# Patient Record
Sex: Male | Born: 1970 | Race: White | Hispanic: No | Marital: Married | Smoking: Never smoker
Health system: Southern US, Community
[De-identification: ages and names within clinical notes are randomized; demographics above are authoritative.]

## PROBLEM LIST (undated history)

## (undated) DIAGNOSIS — E739 Lactose intolerance, unspecified: Secondary | ICD-10-CM

## (undated) DIAGNOSIS — R011 Cardiac murmur, unspecified: Secondary | ICD-10-CM

## (undated) DIAGNOSIS — K219 Gastro-esophageal reflux disease without esophagitis: Secondary | ICD-10-CM

## (undated) DIAGNOSIS — T7840XA Allergy, unspecified, initial encounter: Secondary | ICD-10-CM

## (undated) DIAGNOSIS — E669 Obesity, unspecified: Secondary | ICD-10-CM

## (undated) HISTORY — PX: WISDOM TOOTH EXTRACTION: SHX21

## (undated) HISTORY — PX: TONSILLECTOMY: SUR1361

## (undated) HISTORY — DX: Obesity, unspecified: E66.9

## (undated) HISTORY — DX: Gastro-esophageal reflux disease without esophagitis: K21.9

## (undated) HISTORY — DX: Lactose intolerance, unspecified: E73.9

## (undated) HISTORY — DX: Allergy, unspecified, initial encounter: T78.40XA

## (undated) HISTORY — DX: Cardiac murmur, unspecified: R01.1

## (undated) HISTORY — PX: UPPER GASTROINTESTINAL ENDOSCOPY: SHX188

---

## 1992-01-28 HISTORY — PX: OTHER SURGICAL HISTORY: SHX169

## 1998-01-27 HISTORY — PX: ESOPHAGOGASTRODUODENOSCOPY: SHX1529

## 1998-11-08 ENCOUNTER — Encounter (INDEPENDENT_AMBULATORY_CARE_PROVIDER_SITE_OTHER): Payer: Self-pay | Admitting: Specialist

## 1998-11-08 ENCOUNTER — Other Ambulatory Visit: Admission: RE | Admit: 1998-11-08 | Discharge: 1998-11-08 | Payer: Self-pay | Admitting: Gastroenterology

## 1999-05-29 ENCOUNTER — Encounter: Payer: Self-pay | Admitting: Emergency Medicine

## 1999-05-29 ENCOUNTER — Emergency Department (HOSPITAL_COMMUNITY): Admission: EM | Admit: 1999-05-29 | Discharge: 1999-05-29 | Payer: Self-pay | Admitting: Emergency Medicine

## 2008-03-03 ENCOUNTER — Emergency Department (HOSPITAL_BASED_OUTPATIENT_CLINIC_OR_DEPARTMENT_OTHER): Admission: EM | Admit: 2008-03-03 | Discharge: 2008-03-03 | Payer: Self-pay | Admitting: Emergency Medicine

## 2010-06-11 ENCOUNTER — Encounter: Payer: Self-pay | Admitting: Family Medicine

## 2010-06-11 ENCOUNTER — Ambulatory Visit (INDEPENDENT_AMBULATORY_CARE_PROVIDER_SITE_OTHER): Payer: Managed Care, Other (non HMO) | Admitting: Family Medicine

## 2010-06-11 DIAGNOSIS — R209 Unspecified disturbances of skin sensation: Secondary | ICD-10-CM

## 2010-06-11 DIAGNOSIS — Z0000 Encounter for general adult medical examination without abnormal findings: Secondary | ICD-10-CM | POA: Insufficient documentation

## 2010-06-11 DIAGNOSIS — R5383 Other fatigue: Secondary | ICD-10-CM | POA: Insufficient documentation

## 2010-06-11 DIAGNOSIS — R5381 Other malaise: Secondary | ICD-10-CM

## 2010-06-11 DIAGNOSIS — R202 Paresthesia of skin: Secondary | ICD-10-CM | POA: Insufficient documentation

## 2010-06-11 DIAGNOSIS — E669 Obesity, unspecified: Secondary | ICD-10-CM

## 2010-06-11 NOTE — Assessment & Plan Note (Signed)
L face, no other neuro sxs.  Check B12.  Resolved currently.  Update [place] if returning for further eval.  CN intact today.

## 2010-06-11 NOTE — Assessment & Plan Note (Signed)
Tetanus shot due - forgot to give today. Discussed healthy eating and living. Preventative protocols updated. Return fasting for blood work.

## 2010-06-11 NOTE — Assessment & Plan Note (Signed)
Check routine blood work, B12.  If not improving, consider discussion/eval for OSA.

## 2010-06-11 NOTE — Assessment & Plan Note (Signed)
Body mass index is 35.75 kg/(m^2). Discussed healthy eating/living.  Pt's low weight has been 215 in past, getting back into exercise.

## 2010-06-11 NOTE — Progress Notes (Signed)
Subjective:    Patient ID: Phillip Evans, male    DOB: Jun 02, 1970, 40 y.o.   MRN: [id number]  HPI CC: new patient  First time here.  No previous MD.  Concern: facial tingling.  Last Thursday BP shot up (151/89) and L face felt numb (cheek).  Lasted a few days.  No facial weakness, no trouble speaking, no HA, dizziness, no dysphagia.  Has been taking testosterone booster for 2 wks - noted increased BP with this.  Helps with working out.  But feeling more tired with this.  Stopped this.  Also with fatigue, stays tired despite sleep.    H/o GERD - s/p EGD 2000, pt states normal.  controlled with prilosec and diet.  2 yrs ago lost 65lbs, then plateau.  Tries to eat diverse diet.  Lost of fruits/vegetables.  Takes MVI daily.  Watches salt intake.  Has cut out sodas.  Does drink sweet teas and trying to cut back.  Drinks plenty of water.  Stays active, getting back into exercise routine.  Has had personal trainer in past.  Reports knowledge about what diet should be.  Looking into gluten free diet.    Preventative: Unsure last tetanus shot.  Requests today. Last CPE - none Last blood work - unsure  Medications and allergies reviewed and updated as above. PMHx, SurgHx, SHx and FMHx reviewed for relevance and updated in chart.  Review of Systems  Constitutional: Negative for fever, chills, activity change, appetite change, fatigue and unexpected weight change.  HENT: Negative for hearing loss and neck pain.   Eyes: Negative for visual disturbance.  Respiratory: Negative for cough, choking, chest tightness, shortness of breath and wheezing.   Cardiovascular: Negative for chest pain, palpitations and leg swelling.  Gastrointestinal: Positive for nausea and diarrhea. Negative for vomiting, abdominal pain, constipation, blood in stool and abdominal distention.  Genitourinary: Negative for hematuria and difficulty urinating.  Musculoskeletal: Positive for back pain. Negative for myalgias and  arthralgias.  Skin: Negative for rash.  Neurological: Negative for dizziness, seizures, syncope and headaches.  Hematological: Does not bruise/bleed easily.  Psychiatric/Behavioral: Negative for dysphoric mood. The patient is not nervous/anxious.        Objective:   Physical Exam  Nursing note and vitals reviewed. Constitutional: He is oriented to person, place, and time. He appears well-developed and well-nourished. No distress.  HENT:  Head: Normocephalic and atraumatic.  Right Ear: External ear normal.  Left Ear: External ear normal.  Nose: Nose normal.  Mouth/Throat: Oropharynx is clear and moist. No oropharyngeal exudate.  Eyes: Conjunctivae and EOM are normal. Pupils are equal, round, and reactive to light. No scleral icterus.  Neck: Normal range of motion. Neck supple. No thyromegaly present.  Cardiovascular: Normal rate, regular rhythm, normal heart sounds and intact distal pulses.   No murmur heard. Pulses:      Radial pulses are 2+ on the right side, and 2+ on the left side.  Pulmonary/Chest: Effort normal and breath sounds normal. No respiratory distress. He has no wheezes. He has no rales.  Abdominal: Soft. Bowel sounds are normal. He exhibits no distension and no mass. There is no tenderness. There is no rebound and no guarding.  Musculoskeletal: Normal range of motion.  Lymphadenopathy:    He has no cervical adenopathy.  Neurological: He is alert and oriented to person, place, and time. No cranial nerve deficit. Coordination normal.       CN grossly intact, station and gait intact  Skin: Skin is warm and dry.  No rash noted.  Psychiatric: He has a normal mood and affect. His behavior is normal. Judgment and thought content normal.          Assessment & Plan:

## 2010-06-11 NOTE — Patient Instructions (Signed)
Return at your convenience fasting for blood work. Keep working in diet and carbs.  More lentils, beans, nuts to help with bad cholesterol in general. Return as needed or in 1-2 years for next physical.

## 2010-06-12 ENCOUNTER — Other Ambulatory Visit (INDEPENDENT_AMBULATORY_CARE_PROVIDER_SITE_OTHER): Payer: Managed Care, Other (non HMO) | Admitting: Family Medicine

## 2010-06-12 DIAGNOSIS — Z0000 Encounter for general adult medical examination without abnormal findings: Secondary | ICD-10-CM

## 2010-06-12 DIAGNOSIS — R5381 Other malaise: Secondary | ICD-10-CM

## 2010-06-12 DIAGNOSIS — R5383 Other fatigue: Secondary | ICD-10-CM

## 2010-06-12 DIAGNOSIS — Z23 Encounter for immunization: Secondary | ICD-10-CM

## 2010-06-12 DIAGNOSIS — Z13220 Encounter for screening for lipoid disorders: Secondary | ICD-10-CM

## 2010-06-12 LAB — LIPID PANEL
Cholesterol: 223 mg/dL — ABNORMAL HIGH (ref 0–200)
HDL: 43.1 mg/dL (ref >39.00)
Total CHOL/HDL Ratio: 5
Triglycerides: 198 mg/dL — ABNORMAL HIGH (ref 0.0–149.0)
VLDL: 39.6 mg/dL (ref 0.0–40.0)

## 2010-06-12 LAB — COMPREHENSIVE METABOLIC PANEL
ALT: 51 U/L (ref 0–53)
AST: 31 U/L (ref 0–37)
Albumin: 4 g/dL (ref 3.5–5.2)
Alkaline Phosphatase: 59 U/L (ref 39–117)
BUN: 11 mg/dL (ref 6–23)
CO2: 30 mEq/L (ref 19–32)
Calcium: 9.5 mg/dL (ref 8.4–10.5)
Chloride: 103 mEq/L (ref 96–112)
Creatinine, Ser: 0.9 mg/dL (ref 0.4–1.5)
GFR: 96.94 mL/min (ref >60.00)
Glucose, Bld: 78 mg/dL (ref 70–99)
Potassium: 4.4 mEq/L (ref 3.5–5.1)
Sodium: 141 mEq/L (ref 135–145)
Total Bilirubin: 0.4 mg/dL (ref 0.3–1.2)
Total Protein: 7.2 g/dL (ref 6.0–8.3)

## 2010-06-12 LAB — LDL CHOLESTEROL, DIRECT: Direct LDL: 160.3 mg/dL

## 2010-06-12 LAB — TSH: TSH: 2.6 u[IU]/mL (ref 0.35–5.50)

## 2010-06-17 ENCOUNTER — Encounter: Payer: Self-pay | Admitting: Family Medicine

## 2010-06-17 ENCOUNTER — Ambulatory Visit (INDEPENDENT_AMBULATORY_CARE_PROVIDER_SITE_OTHER): Payer: Managed Care, Other (non HMO) | Admitting: Family Medicine

## 2010-06-17 VITALS — BP 116/78 | HR 76 | Temp 98.3°F

## 2010-06-17 DIAGNOSIS — L0390 Cellulitis, unspecified: Secondary | ICD-10-CM

## 2010-06-17 MED ORDER — DOXYCYCLINE HYCLATE 100 MG PO CAPS: 100.0000 mg | ORAL_CAPSULE | Freq: Two times a day (BID) | ORAL | Status: AC

## 2010-06-17 NOTE — Assessment & Plan Note (Signed)
Concern for cellulitis vs reaction to tetanus shot. Tetanus added to allergy list, treat with elevation of arm, and doxycycline x 10 days to cover infection. Update [place] if not improving as expected. Outlined erythema with sharpie.

## 2010-06-17 NOTE — Patient Instructions (Signed)
Reaction to Tdap or skin infection Elevate arm, ibuprofen, and start doxycycline twice daily for 10 days. Let us know if spreading or fevers or not improving as expected.

## 2010-06-17 NOTE — Progress Notes (Signed)
  Subjective:    Patient ID: Phillip Evans, male    DOB: 1970/02/26, 40 y.o.   MRN: [id number]  HPI CC: tetanus shot reaction?  Given Tdap 06/12/2010.  After that had sore arm but 3-4 days after shot started noticing redness right arm medial to injection site as well as tenderness.  Feels redness spreading some comes in today to be checked.  Review of Systems No fevers/chills, inability to move arm, pruritis, warmth    Objective:   Physical Exam  [nursing notereviewed. Constitutional: He appears well-developed and well-nourished. No distress.  Skin: Skin is warm and dry.          Left medial arm with ~6cm diameter erythematous macule, + calor.  No fluctuance, induration.          Assessment & Plan:

## 2011-10-08 ENCOUNTER — Encounter: Payer: Self-pay | Admitting: Family Medicine

## 2011-10-08 ENCOUNTER — Ambulatory Visit (INDEPENDENT_AMBULATORY_CARE_PROVIDER_SITE_OTHER): Payer: Managed Care, Other (non HMO) | Admitting: Family Medicine

## 2011-10-08 VITALS — BP 140/82 | HR 82 | Temp 98.2°F | Ht 70.0 in | Wt 238.8 lb

## 2011-10-08 DIAGNOSIS — J209 Acute bronchitis, unspecified: Secondary | ICD-10-CM

## 2011-10-08 MED ORDER — ALBUTEROL SULFATE HFA 108 (90 BASE) MCG/ACT IN AERS: 2.0000 | INHALATION_SPRAY | RESPIRATORY_TRACT | Status: DC | PRN

## 2011-10-08 MED ORDER — AZITHROMYCIN 250 MG PO TABS: ORAL_TABLET | ORAL | Status: AC

## 2011-10-08 NOTE — Assessment & Plan Note (Signed)
Will cover with zithromax  Fluids/rest Albuterol inhaler prn - inst in use  Disc symptomatic care - see instructions on AVS  Pt did not want any px cough products  Update if not starting to improve in a week or if worsening

## 2011-10-08 NOTE — Progress Notes (Signed)
Subjective:    Patient ID: Phillip Evans, male    DOB: April 22, 1970, 41 y.o.   MRN: [id number]  HPI Here for uri symptoms  Last week felt really sick - with body aches and chills--fever  Now has some chest congestion and started coughing -- nasty colored mucous Crackles when he breathes , and cannot stop coughing at night  Never had any nasal or throat symptoms   Is not a smoker  Some wheeze for a few days  Took some mucinex dm - not very helpful   Was exp to father with pneumonia- but this was 3 weeks ago   Patient Active Problem List  Diagnosis  . Routine general medical examination at a health care facility  . Obesity  . Fatigue  . Paresthesia  . Cellulitis   Past Medical History  Diagnosis Date  . Obesity   . GERD (gastroesophageal reflux disease)   . Lactose intolerance    Past Surgical History  Procedure Date  . Esophagogastroduodenoscopy 2000    WNL per pt  . R hand surgery 1994    with tendon reconstruction   History  Substance Use Topics  . Smoking status: Never Smoker   . Smokeless tobacco: Never Used  . Alcohol Use: No   Family History  Problem Relation Age of Onset  . Diabetes Father   . Cancer Maternal Grandmother     stomach cancer  . Coronary artery disease Maternal Grandfather 40    CAD/MI  . Cancer Maternal Grandfather     unsure which  . Hypertension Maternal Grandfather   . ALS Maternal Grandfather   . Hypertension Paternal Grandfather    Allergies  Allergen Reactions  . Tetanus Toxoids Other (See Comments)    Swollen, red arm   Current Outpatient Prescriptions on File Prior to Visit  Medication Sig Dispense Refill  . omeprazole (PRILOSEC OTC) 20 MG tablet Take 20 mg by mouth daily as needed.               Review of Systems    Review of Systems  Constitutional: Negative for  and unexpected weight change. pos for malaise sn fatigue and loss of appetite Eyes: Negative for pain and visual disturbance ENT neg for congestion and  ST.  Respiratory: pos for cough with some wheezing, neg for sob   Cardiovascular: Negative for cp or palpitations    Gastrointestinal: Negative for nausea, diarrhea and constipation.  Genitourinary: Negative for urgency and frequency.  Skin: Negative for pallor or rash   Neurological: Negative for weakness, light-headedness, numbness and headaches.  Hematological: Negative for adenopathy. Does not bruise/bleed easily.  Psychiatric/Behavioral: Negative for dysphoric mood. The patient is not nervous/anxious.      Objective:   Physical Exam  Constitutional: He appears well-developed and well-nourished. No distress.       obese and well appearing   HENT:  Head: Normocephalic and atraumatic.  Right Ear: External ear normal.  Left Ear: External ear normal.  Mouth/Throat: Oropharynx is clear and moist.       Nares are boggy No facial tenderness  Eyes: Conjunctivae normal and EOM are normal. Pupils are equal, round, and reactive to light. Right eye exhibits no discharge. Left eye exhibits no discharge.  Neck: Normal range of motion. Neck supple. No thyromegaly present.  Cardiovascular: Normal rate, regular rhythm and normal heart sounds.   Pulmonary/Chest: Effort normal. No respiratory distress. He has wheezes. He has no rales. He exhibits no tenderness.  Diffuse rhonchi No rales Scattered wheeze Nl exp phase  Musculoskeletal: He exhibits no edema.  Lymphadenopathy:    He has no cervical adenopathy.  Neurological: He is alert.  Skin: Skin is warm and dry. No rash noted.  Psychiatric: He has a normal mood and affect.          Assessment & Plan:

## 2011-10-08 NOTE — Patient Instructions (Addendum)
Drink lots of fluids  Rest and take mucinex DM for congestion and cough  Take the zpak for bronchitis  The albuterol inhaler if needed for wheezing  Update if not starting to improve in a week or if worsening

## 2012-01-08 ENCOUNTER — Emergency Department (HOSPITAL_BASED_OUTPATIENT_CLINIC_OR_DEPARTMENT_OTHER)
Admission: EM | Admit: 2012-01-08 | Discharge: 2012-01-08 | Disposition: A | Discharge status: Home / Self Care | Payer: Managed Care, Other (non HMO) | Attending: Emergency Medicine | Admitting: Emergency Medicine

## 2012-01-08 ENCOUNTER — Encounter (HOSPITAL_BASED_OUTPATIENT_CLINIC_OR_DEPARTMENT_OTHER): Payer: Self-pay | Admitting: *Deleted

## 2012-01-08 ENCOUNTER — Emergency Department (HOSPITAL_BASED_OUTPATIENT_CLINIC_OR_DEPARTMENT_OTHER): Payer: Managed Care, Other (non HMO)

## 2012-01-08 DIAGNOSIS — M7989 Other specified soft tissue disorders: Secondary | ICD-10-CM | POA: Insufficient documentation

## 2012-01-08 DIAGNOSIS — K219 Gastro-esophageal reflux disease without esophagitis: Secondary | ICD-10-CM | POA: Insufficient documentation

## 2012-01-08 DIAGNOSIS — E669 Obesity, unspecified: Secondary | ICD-10-CM | POA: Insufficient documentation

## 2012-01-08 NOTE — ED Notes (Signed)
Patient states he woke up on Saturday morning with pain in his left foot.  States he developed swelling on Monday.  No known injury.

## 2012-01-08 NOTE — ED Provider Notes (Signed)
History     CSN: [id number]  Arrival date & time 01/08/12  4098   First MD Initiated Contact with Patient 01/08/12 1906      Chief Complaint  Patient presents with  . Foot Pain    left    (Consider location/radiation/quality/duration/timing/severity/associated sxs/prior treatment) HPI Comments: Pt denies any known injury and states that the symptoms started on 5 days ago, but he the swelling to the left foot didn't start to 3 days ago:pt denies any history of similar symptoms:denies any redness or warmth:pt states that the pain radiates up the shin  Patient is a 41 y.o. male presenting with lower extremity pain. The history is provided by the patient. No language interpreter was used.  Foot Pain This is a new problem. The current episode started in the past 7 days. The problem occurs constantly. The problem has been unchanged. Pertinent negatives include no chest pain. The symptoms are aggravated by bending and walking. He has tried nothing for the symptoms.    Past Medical History  Diagnosis Date  . Obesity   . GERD (gastroesophageal reflux disease)   . Lactose intolerance     Past Surgical History  Procedure Date  . Esophagogastroduodenoscopy 2000    WNL per pt  . R hand surgery 1994    with tendon reconstruction    Family History  Problem Relation Age of Onset  . Diabetes Father   . Cancer Maternal Grandmother     stomach cancer  . Coronary artery disease Maternal Grandfather 55    CAD/MI  . Cancer Maternal Grandfather     unsure which  . Hypertension Maternal Grandfather   . ALS Maternal Grandfather   . Hypertension Paternal Grandfather     History  Substance Use Topics  . Smoking status: Never Smoker   . Smokeless tobacco: Never Used  . Alcohol Use: No      Review of Systems  Constitutional: Negative.   Respiratory: Negative.  Negative for shortness of breath.   Cardiovascular: Negative.  Negative for chest pain.    Allergies  Tetanus  toxoids  Home Medications   Current Outpatient Rx  Name  Route  Sig  Dispense  Refill  . ALBUTEROL SULFATE HFA 108 (90 BASE) MCG/ACT IN AERS   Inhalation   Inhale 2 puffs into the lungs every 4 (four) hours as needed for wheezing.   1 Inhaler   0   . OMEPRAZOLE MAGNESIUM 20 MG PO TBEC   Oral   Take 20 mg by mouth daily as needed.             BP 149/91  Pulse 71  Temp 98.7 F (37.1 C) (Oral)  Resp 20  SpO2 100%  Physical Exam  Nursing note and vitals reviewed. Constitutional: He is oriented to person, place, and time. He appears well-developed and well-nourished.  HENT:  Head: Normocephalic and atraumatic.  Cardiovascular: Normal rate and regular rhythm.   Pulmonary/Chest: Effort normal and breath sounds normal.  Musculoskeletal: Normal range of motion. He exhibits no tenderness.       gneralized swelling noted to the left foot:no redness swelling noted to the area:pulses intact:pt has full rom  Neurological: He is alert and oriented to person, place, and time.  Skin: Skin is warm and dry.  Psychiatric: He has a normal mood and affect.    ED Course  Procedures (including critical care time)  Labs Reviewed - No data to display US Venous Img Lower Unilateral Left  01/08/2012  *  RADIOLOGY REPORT*  Clinical Data: Left foot pain and swelling.  No known injury.  LEFT LOWER EXTREMITY VENOUS DUPLEX ULTRASOUND  Technique: Gray-scale sonography with graded compression, as well as color Doppler and duplex ultrasound, were performed to evaluate the deep venous system of the left lower extremity from the level of the common femoral vein through the popliteal and proximal calf veins. Spectral Doppler was utilized to evaluate flow at rest and with distal augmentation maneuvers.  Comparison: None.  Findings:  Normal compressibility of  the left common femoral, superficial femoral, and popliteal veins is demonstrated, as well as the visualized proximal calf veins.  No filling defects to  suggest DVT on grayscale or color Doppler imaging.  Doppler waveforms show normal direction of venous flow, normal respiratory phasicity and response to augmentation.  IMPRESSION: No evidence of  left lower extremity deep vein thrombosis.   Original Report Authenticated By: Beckie Salts, M.D.    Dg Foot Complete Left  01/08/2012  *RADIOLOGY REPORT*  Clinical Data: Swelling and numbness.  No injury.  No pain.  LEFT FOOT - COMPLETE 3+ VIEW  Comparison: None.  Findings: Anatomic alignment.  No fracture.  Soft tissues appear within normal limits.  No radiopaque foreign body.  Small insertional Achilles spur incidentally noted.  IMPRESSION: No acute osseous abnormality.   Original Report Authenticated By: Andreas Newport, M.D.      1. Foot swelling       MDM  No fracture on clot noted on imaging:exam not consistent with gout or cellulitis:pt has follow up with pcp as needed        Teressa Lower, NP 01/08/12 2135

## 2012-01-08 NOTE — ED Notes (Signed)
Patient transported to X-ray 

## 2012-01-08 NOTE — ED Notes (Signed)
NP at bedside.

## 2012-01-09 NOTE — ED Provider Notes (Signed)
Medical screening examination/treatment/procedure(s) were performed by non-physician practitioner and as supervising physician I was immediately available for consultation/collaboration.  Jones Skene, M.D.     Jones Skene, MD 01/09/12 2114

## 2012-01-30 ENCOUNTER — Ambulatory Visit: Payer: Managed Care, Other (non HMO) | Admitting: Family Medicine

## 2012-02-04 ENCOUNTER — Ambulatory Visit (INDEPENDENT_AMBULATORY_CARE_PROVIDER_SITE_OTHER): Payer: Managed Care, Other (non HMO) | Admitting: Family Medicine

## 2012-02-04 ENCOUNTER — Encounter: Payer: Self-pay | Admitting: Family Medicine

## 2012-02-04 VITALS — BP 124/82 | HR 64 | Temp 98.3°F | Wt 240.5 lb

## 2012-02-04 DIAGNOSIS — Z23 Encounter for immunization: Secondary | ICD-10-CM

## 2012-02-04 DIAGNOSIS — L0291 Cutaneous abscess, unspecified: Secondary | ICD-10-CM

## 2012-02-04 DIAGNOSIS — L0390 Cellulitis, unspecified: Secondary | ICD-10-CM

## 2012-02-04 NOTE — Assessment & Plan Note (Signed)
Resolving well. Recommend finish antibiotics, see pt instructions for plan. Not consistent with gout.

## 2012-02-04 NOTE — Patient Instructions (Signed)
I do think the cellulitis is improving. Finish bactrim. May use aleve or advil as needed. Keep leg elevated. Continue warm compresses

## 2012-02-04 NOTE — Progress Notes (Signed)
  Subjective:    Patient ID: Phillip Evans, male    DOB: 1970-10-28, 42 y.o.   MRN: [id number]  HPI CC: R foot  Recheck of R foot swelling.  Seen at ER with normal xray and doppler 2 wks ago, normal workup.   Next week seen at urgent care (last week), dx with cellulitis, treated with bactrim DS 2 pills bid. Swelling down.  Mildly tender.  Warmth better.  Denies inciting trauma/injury.  Denies break in skin.  No h/o gout.  Past Medical History  Diagnosis Date  . Obesity   . GERD (gastroesophageal reflux disease)   . Lactose intolerance     Review of Systems Per HPI    Objective:   Physical Exam Good pulses L dorsal foot at 3rd MT shaft some induration and erythema, minimal tenderness, no warmth.  No fluctuance. No pedal edema.    Assessment & Plan:

## 2012-05-13 ENCOUNTER — Ambulatory Visit (INDEPENDENT_AMBULATORY_CARE_PROVIDER_SITE_OTHER): Payer: Managed Care, Other (non HMO) | Admitting: Family Medicine

## 2012-05-13 ENCOUNTER — Encounter: Payer: Self-pay | Admitting: Family Medicine

## 2012-05-13 VITALS — BP 130/90 | HR 74 | Temp 98.0°F | Ht 70.0 in | Wt 240.5 lb

## 2012-05-13 DIAGNOSIS — M7989 Other specified soft tissue disorders: Secondary | ICD-10-CM | POA: Insufficient documentation

## 2012-05-13 MED ORDER — CLOTRIMAZOLE 1 % EX CREA: TOPICAL_CREAM | Freq: Two times a day (BID) | CUTANEOUS | Status: DC

## 2012-05-13 MED ORDER — SULFAMETHOXAZOLE-TMP DS 800-160 MG PO TABS: 2.0000 | ORAL_TABLET | Freq: Two times a day (BID) | ORAL | Status: DC

## 2012-05-13 NOTE — Assessment & Plan Note (Signed)
Left foot. In h/o recent cellulitis.  Will treat as presumed rpt cellulitis with bactrim.   Evidence of tinea pedis - discussed treatment of this to decrease risk of reinfection.  Sent in clotrimazole. Recommended elevation of leg as well as increased activity. If persistent, consider checking uric acid.

## 2012-05-13 NOTE — Progress Notes (Signed)
  Subjective:    Patient ID: Phillip Evans, male    DOB: 03-22-1970, 42 y.o.   MRN: [id number]  HPI CC: L foot ?cellulitis recurrence  Treated for foot cellulitis 01/2012 with bactrim, sxs resolved well after one course of abx.  For last few days noticing swelling again as well as mild erythema at top of foot.  When started, felt burning pain at top of foot, but no pain currently.  No fevers/chills, nausea, leg swelling.  Sedentary job, sitting all day. No regular exercise.  Prior ER evaluation (12/2011) included normal xray and doppler.  Wt Readings from Last 3 Encounters:  05/13/12 240 lb 8 oz (109.09 kg)  02/04/12 240 lb 8 oz (109.09 kg)  10/08/11 238 lb 12 oz (108.296 kg)    Past Medical History  Diagnosis Date  . Obesity   . GERD (gastroesophageal reflux disease)   . Lactose intolerance      Review of Systems Per HPI    Objective:   Physical Exam WDWN CM, NAD 2+ DP bilaterally. Sensation intact. Normal cap refill Left foot with mild nonpitting edema dorsally as well as mild erythema.   Maceration and skin breakdown L interdigital web between toes at 3rd/4th toe     Assessment & Plan:

## 2012-05-13 NOTE — Patient Instructions (Signed)
I am worried about repeat cellulitis.  Treat with bactrim twice daily for 10 days. You do have some skin breakdown between toes - fungal infection.  Treat with clotrimazole (lotrimin) twice daily for 3-4 weeks.  After shower, pat skin dry and place cotton between those toes to help keep dry. Let me know how you do with this.

## 2012-05-16 ENCOUNTER — Emergency Department: Payer: Self-pay | Admitting: Emergency Medicine

## 2012-05-19 ENCOUNTER — Encounter: Payer: Self-pay | Admitting: Family Medicine

## 2012-05-19 ENCOUNTER — Telehealth: Payer: Self-pay | Admitting: Family Medicine

## 2012-05-19 ENCOUNTER — Ambulatory Visit (INDEPENDENT_AMBULATORY_CARE_PROVIDER_SITE_OTHER): Payer: Managed Care, Other (non HMO) | Admitting: Family Medicine

## 2012-05-19 VITALS — BP 134/92 | HR 68 | Temp 98.2°F | Wt 240.8 lb

## 2012-05-19 DIAGNOSIS — S8991XA Unspecified injury of right lower leg, initial encounter: Secondary | ICD-10-CM

## 2012-05-19 DIAGNOSIS — M7989 Other specified soft tissue disorders: Secondary | ICD-10-CM

## 2012-05-19 DIAGNOSIS — S99929A Unspecified injury of unspecified foot, initial encounter: Secondary | ICD-10-CM

## 2012-05-19 NOTE — Assessment & Plan Note (Signed)
R partial calf muscle tear - agree with treatment. Discussed future stretching/strengthening calf muscle. ?proximal fibular fracture on xray although not impressive tenderness on palpation today. Regardless, will refer to ortho for further eval of this as well as further management of calf muscle tear.

## 2012-05-19 NOTE — Patient Instructions (Signed)
Let's decrease bactrim to one tablet twice a day for 10 day course. Pass by [facility]'s office for referral to orthopedist. Good to see you today, call us with questions.

## 2012-05-19 NOTE — Telephone Encounter (Signed)
Patient says he is having no pain at this time so he doesn't want to see an Orthopedist at this time. He will call us back if he changes his mind.

## 2012-05-19 NOTE — Assessment & Plan Note (Signed)
Prior concern for L dorsal foot cellulitis. Nausea with bactrim DS 2 bid.  Decrease to 1 bid for full 10 d course.   Improving.  Finish course.

## 2012-05-19 NOTE — Progress Notes (Signed)
  Subjective:    Patient ID: Phillip Evans, male    DOB: 1970/09/03, 42 y.o.   MRN: [id number]  HPI CC: ER f/u for calf tear  Seen at ER 05/16/2012 with concern for left partial gastroc tear sustained while chasing dog, up incline.  Felt popping sensation and had difficulty ambulating afterwards. Records reviewed. Treated with crutches, rest, ice, percocets.  Didn't fill percocets because make him sick.  Only using advil 400mg  prn. Xray of R tib/fib concerning for small proximal fibular fracture on right. Referred to Dr. Ernest Pine ortho by [facility] ER but pt has not scheduled - states prefers [place]. Never had bruising.  R calf swelling. Not using crutches.  No prolonged immobility.  No recent long car or plane rides.  No family or personal hx blood clots.  2 yrs ago had similar injury episode - same right side.  Improved after rest.  Seen here last week with concern for recurring left foot cellulitis, started on bactrim at that time.   Bactrim   Past Medical History  Diagnosis Date  . Obesity   . GERD (gastroesophageal reflux disease)   . Lactose intolerance      Review of Systems Per HPI    Objective:   Physical Exam  Musculoskeletal:  Right calf swelling present. L calf circ 41.5cm R calf circ 45cm  2+ DP bilaterally Left dorsal foot improved edema, mild erythema remains. L 3rd/4th Interdigital web healing.  Mildly tender to palpation proximal fibula on right  Tender to palpation right calf muscle No tenderness to palpation achilles       Assessment & Plan:

## 2014-01-16 ENCOUNTER — Ambulatory Visit (INDEPENDENT_AMBULATORY_CARE_PROVIDER_SITE_OTHER): Payer: Managed Care, Other (non HMO) | Admitting: Family Medicine

## 2014-01-16 ENCOUNTER — Encounter: Payer: Self-pay | Admitting: Family Medicine

## 2014-01-16 VITALS — BP 120/84 | HR 65 | Temp 98.2°F | Ht 69.0 in | Wt 229.5 lb

## 2014-01-16 DIAGNOSIS — M7542 Impingement syndrome of left shoulder: Secondary | ICD-10-CM

## 2014-01-16 DIAGNOSIS — M7522 Bicipital tendinitis, left shoulder: Secondary | ICD-10-CM

## 2014-01-16 DIAGNOSIS — E669 Obesity, unspecified: Secondary | ICD-10-CM

## 2014-01-16 NOTE — Progress Notes (Signed)
Dr. Frederico Hamman T. Kristjan Derner, MD, [facility] [place], [address] Phone: [phone] Fax: [phone]  01/16/2014  Patient: Phillip Evans, MRN: [id number], DOB: 02-21-1970, 43 y.o.  Primary Physician:  Ria Bush, MD  Chief Complaint: Shoulder Pain  Subjective:   This 43 y.o. male patient noted above presents with shoulder pain that has been ongoing for 6 months there is no history of trauma or accident recently The patient denies neck pain or radicular symptoms. Denies dislocation, subluxation, separation of the shoulder. The patient does complain of pain in the overhead plane with significant painful arc of motion.  Left shoulder, for the last six months has noticed that his shoulder is weak. Is a little bit painful. Painful with movement in the bicipital groove.   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   Medications Tried: Tylenol, NSAIDS Ice or Heat: minimally helpful Tried PT: No  Prior shoulder Injury: No Prior surgery: No Prior fracture: No  Also questions about obesity  The PMH, PSH, Social History, Family History, Medications, and allergies have been reviewed in [facility], and have been updated if relevant.  GEN: No fevers, chills. Nontoxic. Primarily MSK c/o today. MSK: Detailed in the HPI GI: tolerating PO intake without difficulty Neuro: No numbness, parasthesias, or tingling associated. Otherwise the pertinent positives of the ROS are noted above.   Objective:   Blood pressure 120/84, pulse 65, temperature 98.2 F (36.8 C), temperature source Oral, height 5\' 9"  (1.753 m), weight 229 lb 8 oz (104.101 kg).  GEN: Well-developed,well-nourished,in no acute distress; alert,appropriate and cooperative throughout examination HEENT: Normocephalic and atraumatic without obvious abnormalities. Ears, externally no deformities PULM: Breathing comfortably in no respiratory  distress EXT: No clubbing, cyanosis, or edema PSYCH: Normally interactive. Cooperative during the interview. Pleasant. Friendly and conversant. Not anxious or depressed appearing. Normal, full affect.  Shoulder: L Inspection: No muscle wasting or winging Ecchymosis/edema: neg  AC joint, scapula, clavicle: NT Cervical spine: NT, full ROM Spurling's: neg Abduction: full, 5/5 Flexion: full, 5/5 IR, full, lift-off: 5/5 ER at neutral: full, 5/5 AC crossover: neg Neer: pos Hawkins: pos Drop Test: neg Empty Can: pos Supraspinatus insertion: mild-mod T Bicipital groove: NT Speed's: POS on L Yergason's: POS on L Sulcus sign: neg Scapular dyskinesis: none C5-T1 intact  Neuro: Sensation intact Grip 5/5   Radiology: No results found.  Assessment and Plan:    Shoulder impingement, left - Plan: Ambulatory referral to Physical Therapy  Biceps tendonitis on left - Plan: Ambulatory referral to Physical Therapy  Obesity  Rotator cuff strengthening and scapular stabilization exercises were reviewed with the patient.  Retraining shoulder mechanics and function was emphasized to the patient with rehab done at least 5-6 days a week.  The patient could benefit from formal PT to assist with scapular stabilization and RTC strengthening.  >25 minutes spent in face to face time with patient, >50% spent in counselling or coordination of care: In addition to above, additional 15 minutes of counseling regarding obesity.  The patient is frustrated with his inability to lose weight.  He is not eating all that well, he continues to drink sugary drinks.  He is working more than 80 hours a week, and he is not sleeping very much.  I did my best to counsel him, regarding a healthy diet, exercise pattern, and lifestyle.  In my opinion there is no simple diet that will take care of this, and he needs  to have a whole scale lifestyle change.   Follow-up: if no improvement in 6-8 weeks  New Prescriptions    No medications on file   Orders Placed This Encounter  Procedures  . Ambulatory referral to Physical Therapy    Signed,  Frederico Hamman T. Daymon Hora, MD   Patient's Medications  New Prescriptions   No medications on file  Previous Medications   OMEPRAZOLE (PRILOSEC OTC) 20 MG TABLET    Take 20 mg by mouth daily as needed.    Modified Medications   No medications on file  Discontinued Medications   CLOTRIMAZOLE (LOTRIMIN) 1 % CREAM    Apply topically 2 (two) times daily. For 3-4 weeks   SULFAMETHOXAZOLE-TRIMETHOPRIM (BACTRIM DS) 800-160 MG PER TABLET    Take 2 tablets by mouth 2 (two) times daily.

## 2014-01-16 NOTE — Progress Notes (Signed)
Pre visit review using our clinic review tool, if applicable. No additional management support is needed unless otherwise documented below in the visit note. 

## 2015-05-22 ENCOUNTER — Emergency Department
Admission: EM | Admit: 2015-05-22 | Discharge: 2015-05-22 | Disposition: A | Discharge status: Home / Self Care | Payer: Managed Care, Other (non HMO) | Attending: Emergency Medicine | Admitting: Emergency Medicine

## 2015-05-22 ENCOUNTER — Encounter: Payer: Self-pay | Admitting: *Deleted

## 2015-05-22 ENCOUNTER — Emergency Department: Payer: Managed Care, Other (non HMO)

## 2015-05-22 DIAGNOSIS — K529 Noninfective gastroenteritis and colitis, unspecified: Secondary | ICD-10-CM | POA: Diagnosis not present

## 2015-05-22 DIAGNOSIS — R1013 Epigastric pain: Secondary | ICD-10-CM | POA: Diagnosis present

## 2015-05-22 DIAGNOSIS — E669 Obesity, unspecified: Secondary | ICD-10-CM | POA: Insufficient documentation

## 2015-05-22 DIAGNOSIS — Z79899 Other long term (current) drug therapy: Secondary | ICD-10-CM | POA: Insufficient documentation

## 2015-05-22 LAB — COMPREHENSIVE METABOLIC PANEL
ALK PHOS: 65 U/L (ref 38–126)
ALT: 28 U/L (ref 17–63)
ANION GAP: 10 (ref 5–15)
AST: 24 U/L (ref 15–41)
Albumin: 4.7 g/dL (ref 3.5–5.0)
BILIRUBIN TOTAL: 0.3 mg/dL (ref 0.3–1.2)
BUN: 16 mg/dL (ref 6–20)
CALCIUM: 9.7 mg/dL (ref 8.9–10.3)
CO2: 24 mmol/L (ref 22–32)
CREATININE: 1.01 mg/dL (ref 0.61–1.24)
Chloride: 103 mmol/L (ref 101–111)
GFR calc non Af Amer: >60 mL/min (ref >60)
Glucose, Bld: 127 mg/dL — ABNORMAL HIGH (ref 65–99)
Potassium: 4 mmol/L (ref 3.5–5.1)
SODIUM: 137 mmol/L (ref 135–145)
TOTAL PROTEIN: 8.2 g/dL — AB (ref 6.5–8.1)

## 2015-05-22 LAB — CBC
HCT: 42.8 % (ref 40.0–52.0)
HEMOGLOBIN: 15.1 g/dL (ref 13.0–18.0)
MCH: 28.3 pg (ref 26.0–34.0)
MCHC: 35.2 g/dL (ref 32.0–36.0)
MCV: 80.2 fL (ref 80.0–100.0)
PLATELETS: 235 10*3/uL (ref 150–440)
RBC: 5.34 MIL/uL (ref 4.40–5.90)
RDW: 13.6 % (ref 11.5–14.5)
WBC: 10.5 10*3/uL (ref 3.8–10.6)

## 2015-05-22 LAB — LIPASE, BLOOD: Lipase: 17 U/L (ref 11–51)

## 2015-05-22 LAB — TROPONIN I: Troponin I: <0.03 ng/mL (ref <0.031)

## 2015-05-22 MED ORDER — ONDANSETRON HCL 4 MG/2ML IJ SOLN
4.0000 mg | Freq: Once | INTRAMUSCULAR | Status: AC
  Administered 2015-05-22: 4 mg via INTRAVENOUS
  Filled 2015-05-22: qty 2

## 2015-05-22 MED ORDER — METRONIDAZOLE 500 MG PO TABS: 500.0000 mg | ORAL_TABLET | Freq: Two times a day (BID) | ORAL | Status: DC

## 2015-05-22 MED ORDER — CIPROFLOXACIN HCL 500 MG PO TABS: 500.0000 mg | ORAL_TABLET | Freq: Two times a day (BID) | ORAL | Status: DC

## 2015-05-22 MED ORDER — IOPAMIDOL (ISOVUE-300) INJECTION 61%
100.0000 mL | Freq: Once | INTRAVENOUS | Status: AC | PRN
  Administered 2015-05-22: 100 mL via INTRAVENOUS

## 2015-05-22 MED ORDER — GI COCKTAIL ~~LOC~~
30.0000 mL | Freq: Once | ORAL | Status: AC
  Administered 2015-05-22: 30 mL via ORAL
  Filled 2015-05-22: qty 30

## 2015-05-22 MED ORDER — DIATRIZOATE MEGLUMINE & SODIUM 66-10 % PO SOLN
15.0000 mL | Freq: Once | ORAL | Status: AC
  Administered 2015-05-22: 15 mL via ORAL
  Filled 2015-05-22: qty 30

## 2015-05-22 MED ORDER — MORPHINE SULFATE (PF) 4 MG/ML IV SOLN
4.0000 mg | Freq: Once | INTRAVENOUS | Status: AC
  Administered 2015-05-22: 4 mg via INTRAVENOUS
  Filled 2015-05-22: qty 1

## 2015-05-22 NOTE — ED Notes (Signed)
States he woke up this AM with LUQ abd pain at 3 am, states vomiting and lightheaded, burning in nature, states last BM was this AM

## 2015-05-22 NOTE — ED Notes (Signed)
Patient transported to CT 

## 2015-05-22 NOTE — ED Provider Notes (Signed)
[facility] Emergency Department Provider Note  ____________________________________________    I have reviewed the triage vital signs and the nursing notes.   HISTORY  Chief Complaint Abdominal Pain    HPI Phillip Evans is a 45 y.o. male who presents with epigastric abdominal pain which started approximately 2 AM this morning. He has had nausea vomiting and has felt lightheaded. He is having normal BMs. No fevers or chills. No history of similar pain. No history of abdominal surgeries. He reports the pain is burning and sharp in nature     Past Medical History  Diagnosis Date  . Obesity   . GERD (gastroesophageal reflux disease)   . Lactose intolerance     Patient Active Problem List   Diagnosis Date Noted  . Right leg injury 05/19/2012  . Foot swelling 05/13/2012  . Obesity 06/11/2010  . Fatigue 06/11/2010  . Paresthesia 06/11/2010    Past Surgical History  Procedure Laterality Date  . Esophagogastroduodenoscopy  2000    WNL per pt  . R hand surgery  1994    with tendon reconstruction    Current Outpatient Rx  Name  Route  Sig  Dispense  Refill  . omeprazole (PRILOSEC OTC) 20 MG tablet   Oral   Take 20 mg by mouth daily as needed.           . ciprofloxacin (CIPRO) 500 MG tablet   Oral   Take 1 tablet (500 mg total) by mouth 2 (two) times daily.   14 tablet   0   . metroNIDAZOLE (FLAGYL) 500 MG tablet   Oral   Take 1 tablet (500 mg total) by mouth 2 (two) times daily after a meal.   14 tablet   0     Allergies Oxycodone and Tetanus toxoids  Family History  Problem Relation Age of Onset  . Diabetes Father   . Cancer Maternal Grandmother     stomach cancer  . Coronary artery disease Maternal Grandfather 44    CAD/MI  . Cancer Maternal Grandfather     unsure which  . Hypertension Maternal Grandfather   . ALS Maternal Grandfather   . Hypertension Paternal Grandfather     Social History Social History  Substance  Use Topics  . Smoking status: Never Smoker   . Smokeless tobacco: Never Used  . Alcohol Use: No    Review of Systems  Constitutional: Negative for fever. Eyes: Negative for redness ENT: Negative for sore throat Cardiovascular: Negative for chest pain Respiratory: Negative for shortness of breath. Gastrointestinal: Abdominal pain as above Genitourinary: Negative for dysuria. Musculoskeletal: Negative for back pain. Skin: Negative for rash. Neurological: Negative for focal weakness Psychiatric: no anxiety    ____________________________________________   PHYSICAL EXAM:  VITAL SIGNS: ED Triage Vitals  Enc Vitals Group     BP 05/22/15 0835 158/89 mmHg     Pulse Rate 05/22/15 0835 61     Resp 05/22/15 0835 18     Temp 05/22/15 0835 97.8 F (36.6 C)     Temp Source 05/22/15 0835 Oral     SpO2 05/22/15 0835 99 %     Weight 05/22/15 0835 230 lb (104.327 kg)     Height 05/22/15 0835 5\' 9"  (1.753 m)     Head Cir --      Peak Flow --      Pain Score 05/22/15 0836 6     Pain Loc --      Pain Edu? --  Excl. in [place]? --      Constitutional: Alert and oriented. Well appearing and in no distress.  Eyes: Conjunctivae are normal. No erythema or injection ENT   Head: Normocephalic and atraumatic.   Mouth/Throat: Mucous membranes are moist. Cardiovascular: Normal rate, regular rhythm. Normal and symmetric distal pulses are present in the upper extremities. Respiratory: Normal respiratory effort without tachypnea nor retractions. Breath sounds are clear and equal bilaterally.  Gastrointestinal: Mild tenderness in the epigastrium.. No distention. There is no CVA tenderness. Genitourinary: deferred  Neurologic:  Normal speech and language. No gross focal neurologic deficits are appreciated. Skin:  Skin is warm, dry and intact. No rash noted. Psychiatric: Mood and affect are normal. Patient exhibits appropriate insight and  judgment.  ____________________________________________    LABS (pertinent positives/negatives)  Labs Reviewed  COMPREHENSIVE METABOLIC PANEL - Abnormal; Notable for the following:    Glucose, Bld 127 (*)    Total Protein 8.2 (*)    All other components within normal limits  LIPASE, BLOOD  CBC  TROPONIN I  URINALYSIS COMPLETEWITH MICROSCOPIC ([facility] ONLY)    ____________________________________________   EKG  ED ECG REPORT I, Lavonia Drafts, the attending physician, personally viewed and interpreted this ECG.  Date: 05/22/2015 EKG Time: 8:40 AM Rate: 58 Rhythm: Sinus bradycardia QRS Axis: normal Intervals: normal ST/T Wave abnormalities: normal Conduction Disturbances: none    ____________________________________________    RADIOLOGY  CT abdomen and pelvis most consistent with enteritis  ____________________________________________   PROCEDURES  Procedure(s) performed: none  Critical Care performed:none  ____________________________________________   INITIAL IMPRESSION / ASSESSMENT AND PLAN / ED COURSE  Pertinent labs & imaging results that were available during my care of the patient were reviewed by me and considered in my medical decision making (see chart for details).  Patient presents with epigastric discomfort, he probably has a history of GERD. Differential includes gastritis, pancreatitis, cholecystitis. We will give a GI cocktail and reevaluate.  Patient had brief improvement with GI cocktail. We will obtain CT scan.  CT is most consistent with enteritis. We will treat with antibiotics and by mouth pain medications. We discussed return precautions ____________________________________________   FINAL CLINICAL IMPRESSION(S) / ED DIAGNOSES  Final diagnoses:  Enteritis          Lavonia Drafts, MD 05/22/15 1534

## 2015-05-23 ENCOUNTER — Ambulatory Visit (INDEPENDENT_AMBULATORY_CARE_PROVIDER_SITE_OTHER): Payer: Managed Care, Other (non HMO) | Admitting: Family Medicine

## 2015-05-23 ENCOUNTER — Telehealth: Payer: Self-pay | Admitting: Family Medicine

## 2015-05-23 ENCOUNTER — Encounter: Payer: Self-pay | Admitting: Family Medicine

## 2015-05-23 VITALS — BP 120/90 | HR 75 | Temp 97.9°F | Wt 227.2 lb

## 2015-05-23 DIAGNOSIS — K529 Noninfective gastroenteritis and colitis, unspecified: Secondary | ICD-10-CM

## 2015-05-23 MED ORDER — ONDANSETRON HCL 4 MG PO TABS: 4.0000 mg | ORAL_TABLET | Freq: Three times a day (TID) | ORAL | Status: DC | PRN

## 2015-05-23 NOTE — Progress Notes (Signed)
Subjective:  Patient ID: Phillip Evans, male    DOB: 08/26/1970  Age: 45 y.o. MRN: [id number]  CC: Abdominal pain, diarrhea, N/V  HPI:  45 year old male presents with the above complaints.  Patient reports that at 2 AM on 4/25 he began to not feel well. He subsequently developed epigastric and left upper quadrant pain. Pain described as a burning sensation. Pain was severe. He then developed diarrhea and nausea and vomiting. He had to 3 episodes of diarrhea and vomiting. Pain continued and given the severity of his pain he went to the emergency department for evaluation. He is ED course was reviewed and is summarized as below: Labs normal. CT scan with findings consistent with enteritis. No other abdominal pathology noted. He was placed on Cipro and Flagyl and discharged home in stable condition. Abdominal pain improved.  He presents today for follow-up regarding this. He states that he currently feels weak but is no longer having any pain, diarrhea, vomiting. He still feels slightly nauseated. He is otherwise feeling well. He is concerned about his recent symptoms especially in the setting of having a similar illness 2 times previously in the last 4 months.  Social Hx   Social History   Social History  . Marital Status: Married    Spouse Name: N/A  . Number of Children: 3  . Years of Education: college   Occupational History  . Secondary school teacher Other   Social History Main Topics  . Smoking status: Never Smoker   . Smokeless tobacco: Never Used  . Alcohol Use: No  . Drug Use: No  . Sexual Activity: Not Asked   Other Topics Concern  . None   Social History Narrative   Caffeine: 4 caffeinated beverages a day   Lives with wife, 3 children, 2 cats         Review of Systems  Constitutional: Positive for fatigue. Negative for fever.  Gastrointestinal: Positive for nausea, vomiting, abdominal pain and diarrhea. Negative for blood in stool.   Objective:  BP 120/90 mmHg  Pulse  75  Temp(Src) 97.9 F (36.6 C) (Oral)  Wt 227 lb 4 oz (103.08 kg)  SpO2 97%  BP/Weight 05/23/2015 05/22/2015 [id number]  Systolic BP 123456 [id number]  Diastolic BP 90 79 84  Wt. (Lbs) 227.25 230 229.5  BMI 33.54 33.95 33.88   Physical Exam  Constitutional: He is oriented to person, place, and time. He appears well-developed. No distress.  Cardiovascular: Normal rate and regular rhythm.   Pulmonary/Chest: Effort normal. He has no wheezes. He has no rales.  Abdominal: Soft. He exhibits no distension. There is no tenderness. There is no rebound and no guarding.  Neurological: He is alert and oriented to person, place, and time.  Psychiatric: He has a normal mood and affect.  Vitals reviewed.  Lab Results  Component Value Date   WBC 10.5 05/22/2015   HGB 15.1 05/22/2015   HCT 42.8 05/22/2015   PLT 235 05/22/2015   GLUCOSE 127* 05/22/2015   CHOL 223* 06/12/2010   TRIG 198.0* 06/12/2010   HDL 43.10 06/12/2010   LDLDIRECT 160.3 06/12/2010   ALT 28 05/22/2015   AST 24 05/22/2015   NA 137 05/22/2015   K 4.0 05/22/2015   CL 103 05/22/2015   CREATININE 1.01 05/22/2015   BUN 16 05/22/2015   CO2 24 05/22/2015   TSH 2.60 06/12/2010   Assessment & Plan:   Problem List Items Addressed This Visit    Enteritis - Primary  New problem. Likely viral in origin. I'm not sure why the emergency department place him on antibiotics. I advised him to continue as he's restarted. His symptoms are now essentially resolved, excluding the nausea. Treating with Zofran. Follow-up as needed.         Meds ordered this encounter  Medications  . ondansetron (ZOFRAN) 4 MG tablet    Sig: Take 1 tablet (4 mg total) by mouth every 8 (eight) hours as needed for nausea or vomiting.    Dispense:  20 tablet    Refill:  0   Follow-up: PRN  [facility] [place]

## 2015-05-23 NOTE — Telephone Encounter (Signed)
Pt has appt to see Dr Lacinda Axon 05/23/15 at 4 pm.

## 2015-05-23 NOTE — Patient Instructions (Signed)
Take the zofran as needed.  Call/follow up if you worsen or this recurs.  Take care  Dr. Lacinda Axon

## 2015-05-23 NOTE — Progress Notes (Signed)
Pre visit review using our clinic review tool, if applicable. No additional management support is needed unless otherwise documented below in the visit note. 

## 2015-05-23 NOTE — Telephone Encounter (Signed)
Patient Name: SAMAD MCCASKEY  DOB: May 27, 1970    Initial Comment caller states he has nausea and vomiting; has urinated   Nurse Assessment  Nurse: Mallie Mussel, RN, Alveta Heimlich Date/Time Eilene Ghazi Time): 05/23/2015 10:31:25 AM  Confirm and document reason for call. If symptomatic, describe symptoms. You must click the next button to save text entered. ---Caller states that he was seen in ER yesterday for severe abdominal pain. Was prescribed an antiboitic and an antifungal for swelling of his intestines. He states that he was taking the medication with food as directed and usually vomits afterwards. He is wanting to know if something could be called in for him for N&V. He has vomited x 4-6 since he started yesterday. He last urinated about 30 minutes ago.  Has the patient traveled out of the country within the last 30 days? ---No  Does the patient have any new or worsening symptoms? ---Yes  Will a triage be completed? ---Yes  Related visit to physician within the last 2 weeks? ---Yes  Does the PT have any chronic conditions? (i.e. diabetes, asthma, etc.) ---No  Is this a behavioral health or substance abuse call? ---No     Guidelines    Guideline Title Affirmed Question Affirmed Notes  Vomiting MILD or MODERATE vomiting (e.g., 1 - 5 times / day) (all triage questions negative)    Final Disposition User   [place], RN, Alveta Heimlich    Comments  denies abdominal pain at present, it comes and goes.  Wants to be seen by doctor.  No appointments available wth Dr. Leo Grosser and others at [facility]. I was able to book an appointment for himi at [facility] with Dr. Thersa Salt for 4:00pm today.   Referrals  REFERRED TO PCP OFFICE   Disagree/Comply: Disagree  Disagree/Comply Reason: Disagree with instructions

## 2015-05-23 NOTE — Assessment & Plan Note (Signed)
New problem. Likely viral in origin. I'm not sure why the emergency department place him on antibiotics. I advised him to continue as he's restarted. His symptoms are now essentially resolved, excluding the nausea. Treating with Zofran. Follow-up as needed.

## 2015-08-28 ENCOUNTER — Encounter: Payer: Self-pay | Admitting: Primary Care

## 2015-08-28 ENCOUNTER — Ambulatory Visit (INDEPENDENT_AMBULATORY_CARE_PROVIDER_SITE_OTHER): Payer: Managed Care, Other (non HMO) | Admitting: Primary Care

## 2015-08-28 ENCOUNTER — Other Ambulatory Visit: Payer: Self-pay | Admitting: Primary Care

## 2015-08-28 VITALS — BP 134/84 | HR 67 | Temp 98.1°F | Ht 69.0 in | Wt 239.4 lb

## 2015-08-28 DIAGNOSIS — R5383 Other fatigue: Secondary | ICD-10-CM

## 2015-08-28 DIAGNOSIS — E538 Deficiency of other specified B group vitamins: Secondary | ICD-10-CM

## 2015-08-28 DIAGNOSIS — E559 Vitamin D deficiency, unspecified: Secondary | ICD-10-CM

## 2015-08-28 DIAGNOSIS — F419 Anxiety disorder, unspecified: Secondary | ICD-10-CM

## 2015-08-28 LAB — COMPREHENSIVE METABOLIC PANEL
ALK PHOS: 62 U/L (ref 39–117)
ALT: 25 U/L (ref 0–53)
AST: 18 U/L (ref 0–37)
Albumin: 4.3 g/dL (ref 3.5–5.2)
BUN: 13 mg/dL (ref 6–23)
CO2: 30 mEq/L (ref 19–32)
Calcium: 9.5 mg/dL (ref 8.4–10.5)
Chloride: 102 mEq/L (ref 96–112)
Creatinine, Ser: 1.05 mg/dL (ref 0.40–1.50)
GFR: 82.07 mL/min (ref >60.00)
GLUCOSE: 95 mg/dL (ref 70–99)
POTASSIUM: 4.5 meq/L (ref 3.5–5.1)
Sodium: 139 mEq/L (ref 135–145)
TOTAL PROTEIN: 7.5 g/dL (ref 6.0–8.3)
Total Bilirubin: 0.5 mg/dL (ref 0.2–1.2)

## 2015-08-28 LAB — CBC
HCT: 40.8 % (ref 39.0–52.0)
HEMOGLOBIN: 14.1 g/dL (ref 13.0–17.0)
MCHC: 34.6 g/dL (ref 30.0–36.0)
MCV: 81.7 fl (ref 78.0–100.0)
PLATELETS: 211 10*3/uL (ref 150.0–400.0)
RBC: 5 Mil/uL (ref 4.22–5.81)
RDW: 13 % (ref 11.5–15.5)
WBC: 6 10*3/uL (ref 4.0–10.5)

## 2015-08-28 LAB — HEMOGLOBIN A1C: Hgb A1c MFr Bld: 5.4 % (ref 4.6–6.5)

## 2015-08-28 LAB — TSH: TSH: 3.05 u[IU]/mL (ref 0.35–4.50)

## 2015-08-28 LAB — VITAMIN D 25 HYDROXY (VIT D DEFICIENCY, FRACTURES): VITD: 27.82 ng/mL — AB (ref 30.00–100.00)

## 2015-08-28 LAB — VITAMIN B12: VITAMIN B 12: 198 pg/mL — AB (ref 211–911)

## 2015-08-28 NOTE — Assessment & Plan Note (Signed)
Persistent for the past 2 months also with dizziness that has since subsided.  Long discussion today regarding stress and anxiety at work which is likely to be contributing to fatigue. GAD 7 score of 10 today although does not appear to be anxious when not at work. He does endorse feeling more anxious and stressed since starting this position in August 2016.  Highly suspect fatigue is related to anxiety and stress from his occupation. Do not suspect sleep apnea at this time. Will check basic labs to ensure no metabolic cause. Discussed importance of stress reduction techniques including therapy for which he is interested. Referral placed as I believe he will benefit.   Also discussed importance of regular exercise and healthy diet to treat symptoms of fatigue, anxiety, stress for which he does not currently partake.  Will await labs and have him under go therapy. Return precautions provided and he will update if no improvement.

## 2015-08-28 NOTE — Progress Notes (Signed)
Subjective:    Patient ID: Phillip Evans, male    DOB: January 09, 1971, 45 y.o.   MRN: [id number]  HPI  Phillip Evans is a 45 year old male who presents today with a chief complaint of dizziness and fatigue.  His fatigue has been present consistently for the past 2 months. He will experience a full night's sleep of 8-9 hours and wake up tired as if he's not rested. He will experience times throughout his work day where he could easily doze off. This past weekend he felt as though he had no strength and couldn't stay awake after working in the yard for several hours.   He denies snoring and periods of apnea, cough, unexplained weight loss, bloody stools, cold intolerance, polyuria.   He wakes up at 6 am, drives 1 hour to work, leaves work at 6:30 pm, gets home by 7:30- 8 pm, and will get to bed around 10:30 pm. He endorses a high stress occupation, especially over the past 6 months. He's felt anxious, has noticed worrying about insignificant things with difficulty controlling his worry, and increased irritability. He's been in his current occupation since August 2016. GAD 7 score of 10 today.  His dizziness began Thursday last week after waking. He felt nauseated as well. This improved after he layed down. The symptoms began again after he was moving around at work on Thursday during the day. The dizziness resolved Friday. He drinks 5-6 bottles of water daily, but has not had as much water lately.   Review of Systems  Constitutional: Positive for fatigue. Negative for fever.  Respiratory: Negative for cough and shortness of breath.   Cardiovascular: Negative for chest pain.  Endocrine: Negative for cold intolerance and polyuria.  Neurological: Negative for dizziness, weakness and numbness.  Psychiatric/Behavioral: Negative for sleep disturbance. The patient is nervous/anxious.        Past Medical History:  Diagnosis Date  . GERD (gastroesophageal reflux disease)   . Lactose intolerance   .  Obesity      Social History   Social History  . Marital status: Married    Spouse name: N/A  . Number of children: 3  . Years of education: college   Occupational History  . Secondary school teacher Other   Social History Main Topics  . Smoking status: Never Smoker  . Smokeless tobacco: Never Used  . Alcohol use No  . Drug use: No  . Sexual activity: Not on file   Other Topics Concern  . Not on file   Social History Narrative   Caffeine: 4 caffeinated beverages a day   Lives with wife, 3 children, 2 cats          Past Surgical History:  Procedure Laterality Date  . ESOPHAGOGASTRODUODENOSCOPY  2000   WNL per pt  . R hand surgery  1994   with tendon reconstruction    Family History  Problem Relation Age of Onset  . Diabetes Father   . Cancer Maternal Grandmother     stomach cancer  . Coronary artery disease Maternal Grandfather 61    CAD/MI  . Cancer Maternal Grandfather     unsure which  . Hypertension Maternal Grandfather   . ALS Maternal Grandfather   . Hypertension Paternal Grandfather     Allergies  Allergen Reactions  . Oxycodone Nausea Only  . Tetanus Toxoids Other (See Comments)    Swollen, red arm    Current Outpatient Prescriptions on File Prior to Visit  Medication Sig Dispense  Refill  . omeprazole (PRILOSEC OTC) 20 MG tablet Take 20 mg by mouth daily as needed.       No current facility-administered medications on file prior to visit.     BP 134/84 (BP Location: Left Arm, Patient Position: Sitting, Cuff Size: Normal)   Pulse 67   Temp 98.1 F (36.7 C) (Oral)   Ht 5\' 9"  (1.753 m)   Wt 239 lb 6.4 oz (108.6 kg)   SpO2 98%   BMI 35.35 kg/m    Objective:   Physical Exam  Constitutional: He is oriented to person, place, and time. He appears well-nourished.  Eyes: Pupils are equal, round, and reactive to light.  Neck: Neck supple.  Cardiovascular: Normal rate and regular rhythm.   Pulmonary/Chest: Effort normal and breath sounds normal.    Neurological: He is alert and oriented to person, place, and time. No cranial nerve deficit.  Skin: Skin is warm and dry.  Psychiatric: He has a normal mood and affect.          Assessment & Plan:  >30 minutes spent face to face with patient, >50% spent counseling or coordinating care.

## 2015-08-28 NOTE — Progress Notes (Signed)
Pre visit review using our clinic review tool, if applicable. No additional management support is needed unless otherwise documented below in the visit note. 

## 2015-08-28 NOTE — Patient Instructions (Signed)
Complete lab work prior to leaving today.   Start exercising. You should be getting 1 hour of moderate intensity exercise 5 days weekly, but start with 1-2 days per week as discussed.  Ensure you're eating a healthy diet. Limit fast food, junk food, fried foods. Include more fresh fruits and vegetables, lean protein, whole grains.  Ensure you are consuming 64 ounces of water daily.  I will be in touch with you later today or tomorrow.  It was a pleasure meeting you!  Fatigue Fatigue is feeling tired all of the time, a lack of energy, or a lack of motivation. Occasional or mild fatigue is often a normal response to activity or life in general. However, long-lasting (chronic) or extreme fatigue may indicate an underlying medical condition. HOME CARE INSTRUCTIONS  Watch your fatigue for any changes. The following actions may help to lessen any discomfort you are feeling:  Talk to your health care provider about how much sleep you need each night. Try to get the required amount every night.  Take medicines only as directed by your health care provider.  Eat a healthy and nutritious diet. Ask your health care provider if you need help changing your diet.  Drink enough fluid to keep your urine clear or pale yellow.  Practice ways of relaxing, such as yoga, meditation, massage therapy, or acupuncture.  Exercise regularly.   Change situations that cause you stress. Try to keep your work and personal routine reasonable.  Do not abuse illegal drugs.  Limit alcohol intake to no more than 1 drink per day for nonpregnant women and 2 drinks per day for men. One drink equals 12 ounces of beer, 5 ounces of wine, or 1 ounces of hard liquor.  Take a multivitamin, if directed by your health care provider. SEEK MEDICAL CARE IF:   Your fatigue does not get better.  You have a fever.   You have unintentional weight loss or gain.  You have headaches.   You have difficulty:   Falling  asleep.  Sleeping throughout the night.  You feel angry, guilty, anxious, or sad.   You are unable to have a bowel movement (constipation).   You skin is dry.   Your legs or another part of your body is swollen.  SEEK IMMEDIATE MEDICAL CARE IF:   You feel confused.   Your vision is blurry.  You feel faint or pass out.   You have a severe headache.   You have severe abdominal, pelvic, or back pain.   You have chest pain, shortness of breath, or an irregular or fast heartbeat.   You are unable to urinate or you urinate less than normal.   You develop abnormal bleeding, such as bleeding from the rectum, vagina, nose, lungs, or nipples.  You vomit blood.   You have thoughts about harming yourself or committing suicide.   You are worried that you might harm someone else.    This information is not intended to replace advice given to you by your health care provider. Make sure you discuss any questions you have with your health care provider.   Document Released: 11/10/2006 Document Revised: 02/03/2014 Document Reviewed: 05/17/2013 Elsevier Interactive Patient Education [employer].

## 2015-09-05 ENCOUNTER — Telehealth: Payer: Self-pay | Admitting: Primary Care

## 2015-09-05 NOTE — Telephone Encounter (Signed)
Pt placed on [facility] W[facility]. Pt is aware he can call and schedule with counselor and may request to be schedule at [facility].

## 2015-09-13 ENCOUNTER — Ambulatory Visit: Payer: Managed Care, Other (non HMO) | Admitting: Psychology

## 2015-09-27 ENCOUNTER — Encounter: Payer: Self-pay | Admitting: Family Medicine

## 2015-09-27 ENCOUNTER — Ambulatory Visit (INDEPENDENT_AMBULATORY_CARE_PROVIDER_SITE_OTHER): Payer: Managed Care, Other (non HMO) | Admitting: Family Medicine

## 2015-09-27 VITALS — BP 122/84 | HR 72 | Temp 98.1°F | Wt 239.2 lb

## 2015-09-27 DIAGNOSIS — E538 Deficiency of other specified B group vitamins: Secondary | ICD-10-CM | POA: Diagnosis not present

## 2015-09-27 DIAGNOSIS — E669 Obesity, unspecified: Secondary | ICD-10-CM

## 2015-09-27 DIAGNOSIS — R300 Dysuria: Secondary | ICD-10-CM

## 2015-09-27 LAB — POC URINALSYSI DIPSTICK (AUTOMATED)
Bilirubin, UA: NEGATIVE
Blood, UA: NEGATIVE
Glucose, UA: NEGATIVE
Ketones, UA: NEGATIVE
Leukocytes, UA: NEGATIVE
Nitrite, UA: NEGATIVE
Protein, UA: NEGATIVE
Spec Grav, UA: >=1.03
Urobilinogen, UA: 0.2
pH, UA: 6

## 2015-09-27 MED ORDER — SULFAMETHOXAZOLE-TRIMETHOPRIM 800-160 MG PO TABS: 1.0000 | ORAL_TABLET | Freq: Two times a day (BID) | ORAL | 0 refills | Status: DC

## 2015-09-27 NOTE — Assessment & Plan Note (Addendum)
Discussed vit B12 daily. Anticipate PPI related - pt will alternate PPI with H2 blocker for GERD sxs.

## 2015-09-27 NOTE — Progress Notes (Signed)
Pre visit review using our clinic review tool, if applicable. No additional management support is needed unless otherwise documented below in the visit note. 

## 2015-09-27 NOTE — Patient Instructions (Addendum)
For possible prostate infection - 2 wk bactrim course twice daily with food. Ibuprofen as needed for discomfort/irritation.  Continue b12 vitamin daily to raise your levels. If ongoing symptoms despite treatment, let us know.

## 2015-09-27 NOTE — Progress Notes (Signed)
BP 122/84   Pulse 72   Temp 98.1 F (36.7 C) (Oral)   Wt 239 lb 4 oz (108.5 kg)   SpO2 98%   BMI 35.33 kg/m    CC: ?UTI Subjective:    Patient ID: Phillip Evans, male    DOB: 03-Apr-1970, 45 y.o.   MRN: [id number]  HPI: Phillip Evans is a 45 y.o. male presenting on 09/27/2015 for Urinary Tract Infection   2 wk h/o prickly irritation when voiding. This started on trip to beach. Was taking advil every morning when he awoke due to associated lower back pain. Some increased frequency as well. ++ nocturia especially last night. Some chafing of bathing suit on penis and groin - treated with gold bond and now better.   Staying well hydrated. He did start B complex vitamin recently.    Denies fevers, abd pain, nausea/vomiting, rectal or bottom pain. No hematuria, urgency. No new penile lesions, urethral discharge.   Takes omeprazole daily. No recent abx.  No h/o UTI, kidney stones.   Relevant past medical, surgical, family and social history reviewed and updated as indicated. Interim medical history since our last visit reviewed. Allergies and medications reviewed and updated. Current Outpatient Prescriptions on File Prior to Visit  Medication Sig  . omeprazole (PRILOSEC OTC) 20 MG tablet Take 20 mg by mouth daily as needed.     No current facility-administered medications on file prior to visit.     Review of Systems Per HPI unless specifically indicated in ROS section     Objective:    BP 122/84   Pulse 72   Temp 98.1 F (36.7 C) (Oral)   Wt 239 lb 4 oz (108.5 kg)   SpO2 98%   BMI 35.33 kg/m   Wt Readings from Last 3 Encounters:  09/27/15 239 lb 4 oz (108.5 kg)  08/28/15 239 lb 6.4 oz (108.6 kg)  05/23/15 227 lb 4 oz (103.1 kg)    Physical Exam  Constitutional: He appears well-developed and well-nourished. No distress.  HENT:  Mouth/Throat: Oropharynx is clear and moist. No oropharyngeal exudate.  Cardiovascular: Normal rate, regular rhythm, normal heart sounds  and intact distal pulses.   No murmur heard. Pulmonary/Chest: Effort normal and breath sounds normal. No respiratory distress. He has no wheezes. He has no rales.  Abdominal: Soft. Normal appearance and bowel sounds are normal. He exhibits no distension and no mass. There is no hepatosplenomegaly. There is no tenderness. There is no rigidity, no rebound, no guarding, no CVA tenderness and negative Murphy's sign. No hernia.  Genitourinary:  Genitourinary Comments: DRE declined  Musculoskeletal: He exhibits no edema.  Skin: Skin is warm and dry. No rash noted.  Nursing note and vitals reviewed.  Results for orders placed or performed in visit on 09/27/15  POCT Urinalysis Dipstick (Automated)  Result Value Ref Range   Color, UA Yellow    Clarity, UA Clear    Glucose, UA Neg    Bilirubin, UA Neg    Ketones, UA Neg    Spec Grav, UA >=1.030    Blood, UA Neg    pH, UA 6.0    Protein, UA Neg    Urobilinogen, UA 0.2    Nitrite, UA Neg    Leukocytes, UA Negative Negative      Assessment & Plan:   Problem List Items Addressed This Visit    B12 deficiency    Discussed vit B12 daily. Anticipate PPI related - pt will alternate PPI with H2 blocker  for GERD sxs.      Dysuria - Primary    Voiding irritation with some lower back pain, malaise, nocturia over last few weeks. ?prostatitis. Pt declined DRE. Will treat as such with bactrim DS 2 wk course. Also discussed avoiding bladder irritants and suggested NSAID use. Update if ongoing symptoms for further evaluation.      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       Other Visit Diagnoses   None.      Follow up plan: Return if symptoms worsen or fail to improve.  Ria Bush, MD

## 2015-09-27 NOTE — Assessment & Plan Note (Signed)
Voiding irritation with some lower back pain, malaise, nocturia over last few weeks. ?prostatitis. Pt declined DRE. Will treat as such with bactrim DS 2 wk course. Also discussed avoiding bladder irritants and suggested NSAID use. Update if ongoing symptoms for further evaluation.

## 2015-09-27 NOTE — Assessment & Plan Note (Signed)
Pt motivated to work on healthy diet and exercise changes to achieve sustainable weight loss.

## 2015-10-04 ENCOUNTER — Ambulatory Visit (INDEPENDENT_AMBULATORY_CARE_PROVIDER_SITE_OTHER): Payer: Managed Care, Other (non HMO) | Admitting: Psychology

## 2015-10-04 DIAGNOSIS — F4322 Adjustment disorder with anxiety: Secondary | ICD-10-CM

## 2015-10-09 ENCOUNTER — Ambulatory Visit (INDEPENDENT_AMBULATORY_CARE_PROVIDER_SITE_OTHER): Payer: Managed Care, Other (non HMO) | Admitting: Internal Medicine

## 2015-10-09 ENCOUNTER — Encounter: Payer: Self-pay | Admitting: Internal Medicine

## 2015-10-09 VITALS — BP 122/78 | HR 74 | Temp 97.9°F | Wt 237.8 lb

## 2015-10-09 DIAGNOSIS — T887XXA Unspecified adverse effect of drug or medicament, initial encounter: Secondary | ICD-10-CM

## 2015-10-09 DIAGNOSIS — T50905A Adverse effect of unspecified drugs, medicaments and biological substances, initial encounter: Secondary | ICD-10-CM

## 2015-10-09 MED ORDER — PREDNISONE 10 MG PO TABS: ORAL_TABLET | ORAL | 0 refills | Status: DC

## 2015-10-09 NOTE — Progress Notes (Signed)
Subjective:    Patient ID: Phillip Evans, male    DOB: [birth year], 45 y.o.   MRN: [id number]  HPI  Pt presents to the clinic today with c/o a rash. He reports this started yesterday. It started on his thighs, and spread to his legs, arms, chest and back. The rash is very itchy and he reports it burns at times. He does have some associated fatigue and body aches, but reports he has not been running any fever. He has take Zyrtec with some relief. He is not sure what may have caused this. [place], but does not recall coming in contact with anything that he is allergic to. He denies recent tick bite. He was started Septra 12 days ago for acute prostatitis and reports those symptoms have completely resolved.  Review of Systems      Past Medical History:  Diagnosis Date  . GERD (gastroesophageal reflux disease)   . Lactose intolerance   . Obesity     Current Outpatient Prescriptions  Medication Sig Dispense Refill  . omeprazole (PRILOSEC OTC) 20 MG tablet Take 20 mg by mouth daily as needed.      . predniSONE (DELTASONE) 10 MG tablet Take 6 tabs day 1, 5 tabs day 2, 4 tabs day 3, 3 tabs day 4, 2 tabs day 5, 1 tab day 6 21 tablet 0   No current facility-administered medications for this visit.     Allergies  Allergen Reactions  . Oxycodone Nausea Only  . Sulfur Hives  . Tetanus Toxoids Other (See Comments)    Swollen, red arm    Family History  Problem Relation Age of Onset  . Diabetes Father   . Cancer Maternal Grandmother     stomach cancer  . Coronary artery disease Maternal Grandfather 42    CAD/MI  . Cancer Maternal Grandfather     unsure which  . Hypertension Maternal Grandfather   . ALS Maternal Grandfather   . Hypertension Paternal Grandfather     Social History   Social History  . Marital status: Married    Spouse name: N/A  . Number of children: 3  . Years of education: college   Occupational History  . Secondary school teacher  Other   Social History Main Topics  . Smoking status: Never Smoker  . Smokeless tobacco: Never Used  . Alcohol use No  . Drug use: No  . Sexual activity: Not on file   Other Topics Concern  . Not on file   Social History Narrative   Caffeine: 4 caffeinated beverages a day   Lives with wife, 3 children, 2 cats           Constitutional: Pt reports fatigue. Denies fever, malaise, headache or abrupt weight changes.  Respiratory: Denies difficulty breathing, shortness of breath, cough or sputum production.   Cardiovascular: Denies chest pain, chest tightness, palpitations or swelling in the hands or feet.  Gastrointestinal: Denies abdominal pain, bloating, constipation, diarrhea or blood in the stool.  Musculoskeletal: Pt reports body aches. Denies decrease in range of motion, difficulty with gait, muscle pain or joint pain and swelling.  Skin: Pt reports rash.      No other specific complaints in a complete review of systems (except as listed in HPI above).  Objective:   Physical Exam   BP 122/78   Pulse 74   Temp 97.9 F (36.6 C) (Oral)   Wt 237 lb 12 oz (107.8 kg)  SpO2 98%   BMI 35.11 kg/m  Wt Readings from Last 3 Encounters:  10/09/15 237 lb 12 oz (107.8 kg)  09/27/15 239 lb 4 oz (108.5 kg)  08/28/15 239 lb 6.4 oz (108.6 kg)    General: Appears his stated age, well developed, well nourished in NAD. Skin: Convalescent maculopapular rash noted on chest, abdomen, back, arms and legs. Cardiovascular: Normal rate and rhythm. S1,S2 noted.  Pulmonary/Chest: Normal effort and positive vesicular breath sounds. No respiratory distress. No wheezes, rales or ronchi noted.   BMET    Component Value Date/Time   NA 139 08/28/2015 0924   K 4.5 08/28/2015 0924   CL 102 08/28/2015 0924   CO2 30 08/28/2015 0924   GLUCOSE 95 08/28/2015 0924   BUN 13 08/28/2015 0924   CREATININE 1.05 08/28/2015 0924   CALCIUM 9.5 08/28/2015 0924   GFRNONAA >60 05/22/2015 0842   GFRAA >60  05/22/2015 0842    Lipid Panel     Component Value Date/Time   CHOL 223 (H) 06/12/2010 1022   TRIG 198.0 (H) 06/12/2010 1022   HDL 43.10 06/12/2010 1022   CHOLHDL 5 06/12/2010 1022   VLDL 39.6 06/12/2010 1022    CBC    Component Value Date/Time   WBC 6.0 08/28/2015 0924   RBC 5.00 08/28/2015 0924   HGB 14.1 08/28/2015 0924   HCT 40.8 08/28/2015 0924   PLT 211.0 08/28/2015 0924   MCV 81.7 08/28/2015 0924   MCH 28.3 05/22/2015 0842   MCHC 34.6 08/28/2015 0924   RDW 13.0 08/28/2015 0924    Hgb A1C Lab Results  Component Value Date   HGBA1C 5.4 08/28/2015           Assessment & Plan:   Medication reaction:  Stop Septra, no need to treat with additional abx Septra added to allergy list eRx for Pred Taper x 6 days Can continue Zyrtec or Benadryl OTC as needed  RTC as needed or if symptoms persist or worsen Reyne Falconi, NP

## 2015-10-09 NOTE — Patient Instructions (Signed)
Drug Allergy Allergic reactions to medicines are common. Some allergic reactions are mild. A delayed type of drug allergy that occurs 1 week or more after exposure to a medicine or vaccine is called serum sickness. A life-threatening, sudden (acute) allergic reaction that involves the whole body is called anaphylaxis. CAUSES  "True" drug allergies occur when there is an allergic reaction to a medicine. This is caused by overactivity of the immune system. First, the body becomes sensitized. The immune system is triggered by your first exposure to the medicine. Following this first exposure, future exposure to the same medicine may be life-threatening. Almost any medicine can cause an allergic reaction. Common ones are:  Penicillin.  Sulfonamides (sulfa drugs).  Local anesthetics.  X-ray dyes that contain iodine. SYMPTOMS  Common symptoms of a minor allergic reaction are:  Swelling around the mouth.  An itchy red rash or hives.  Vomiting or diarrhea. Anaphylaxis can cause swelling of the mouth and throat. This makes it difficult to breathe and swallow. Severe reactions can be fatal within seconds, even after exposure to only a trace amount of the drug that causes the reaction. HOME CARE INSTRUCTIONS  If you are unsure of what caused your reaction, write down:  The names of the medicines you took.  How much medicine you took.  How you took the medicine, such as whether you took a pill, injected the medicine, or applied it to your skin.  All of the things you ate and drank.  The date and time of your reaction.  The symptoms of the reaction.  You may want to follow up with an allergy specialist after the reaction has cleared in order to be tested to confirm the allergy. It is important to confirm that your reaction is an allergy, not just a side effect to the medicine. If you have a true allergy to a medicine, this may prevent that medicine and related medicines from being given to  you when you are very ill.  If you have hives or a rash:  Take medicines as directed by your caregiver.  You may use an over-the-counter antihistamine (diphenhydramine) as needed.  Apply cold compresses to the skin or take baths in cool water. Avoid hot baths or showers.  If you are severely allergic:  Continuous observation after a severe reaction may be needed. Hospitalization is often required.  Wear a medical alert bracelet or necklace stating your allergy.  You and your family must learn how to use an anaphylaxis kit or give an epinephrine injection to temporarily treat an emergency allergic reaction. If you have had a severe reaction, always carry your epinephrine injection or anaphylaxis kit with you. This can be lifesaving if you have a severe reaction.  Do not drive or perform tasks after treatment until the medicines used to treat your reaction have worn off, or until your caregiver says it is okay.  If you have a drug allergy that was confirmed by your health care provider:  Carry information about the drug allergy with you at all times.  Always check with a pharmacist before taking any over-the-counter medicine. SEEK MEDICAL CARE IF:   You think you had an allergic reaction. Symptoms usually start within 30 minutes after exposure.  Symptoms are getting worse rather than better.  You develop new symptoms.  The symptoms that brought you to your caregiver return. SEEK IMMEDIATE MEDICAL CARE IF:   You have swelling of the mouth, difficulty breathing, or wheezing.  You have a tight  feeling in your chest or throat.  You develop hives, swelling, or itching all over your body.  You develop severe vomiting or diarrhea.  You feel faint or pass out. This is an emergency. Use your epinephrine injection or anaphylaxis kit as you have been instructed. Call for emergency medical help. Even if you improve after the injection, you need to be examined at a hospital emergency  department. MAKE SURE YOU:   Understand these instructions.  Will watch your condition.  Will get help right away if you are not doing well or get worse.   This information is not intended to replace advice given to you by your health care provider. Make sure you discuss any questions you have with your health care provider.   Document Released: 01/13/2005 Document Revised: 02/03/2014 Document Reviewed: 08/15/2014 Elsevier Interactive Patient Education 2016 [employer]  

## 2015-10-09 NOTE — Progress Notes (Signed)
   Subjective:    Patient ID: Phillip Evans, male    DOB: 05/04/1970, 45 y.o.   MRN: [id number]  HPI  45yo M with a history of obesity, dysuria, enteritis, and B12 deficiency who was recently treated (8/31) with a two week course of Septra for prostitis. Pt comes in today with c/o of a rash/welts that began in his right upper thigh yesterday afternoon and has now spread systemically over his abdomen, back, bilateral arms and legs. He has taken Zyrtec yesterday afternoon with moderate relief, but awoke this AM with a more progressive, itching,and swollen rash. He admits to chills, fatigue and muscle aches. Denies fevers, nausea, vomiting. He cannot recall if he has ever been prescribed sulfa antibiotics in the past. Denies any know tick bites with attachment or other significant changes in diet or laundering habits.      Review of Systems  Constitutional: Positive for chills and fatigue. Negative for activity change, appetite change, diaphoresis, fever and unexpected weight change.  HENT: Negative.   Respiratory: Negative for apnea, chest tightness, shortness of breath, wheezing and stridor.   Cardiovascular: Negative for chest pain, palpitations and leg swelling.  Skin: Positive for rash. Negative for color change, pallor and wound.  Neurological: Negative for dizziness, tremors, weakness and headaches.       Objective:   Physical Exam  Constitutional: He is oriented to person, place, and time.  Pt appears overweight, in no apparent distress. Denies fever, nausea, vomiting. No abnormal changes in weight.   Cardiovascular: Normal rate, regular rhythm, normal heart sounds and intact distal pulses.   Pulmonary/Chest: Effort normal and breath sounds normal. No respiratory distress. He has no wheezes. He has no rales. He exhibits no tenderness.  Neurological: He is alert and oriented to person, place, and time.  Skin:  Skin: Warm, dry, intact. Systemic convalescent maculopapular rash present  systemically over bilateral arms, legs, trunk, and back with pruritis.   Psychiatric: He has a normal mood and affect. His behavior is normal. Judgment and thought content normal.          Assessment & Plan:  Assessment:  -Allergic dermatitis related to medication reaction    Plan:  -Prednisone Rx sent  -Educated to call the office if the rash continues to worsen or if does not begin to clear with the start of prednisone.

## 2015-11-08 ENCOUNTER — Ambulatory Visit: Payer: Managed Care, Other (non HMO) | Admitting: Psychology

## 2015-11-28 ENCOUNTER — Other Ambulatory Visit: Payer: Managed Care, Other (non HMO)

## 2015-12-19 ENCOUNTER — Other Ambulatory Visit: Payer: Managed Care, Other (non HMO)

## 2016-07-14 ENCOUNTER — Telehealth: Payer: Self-pay | Admitting: Family Medicine

## 2016-07-14 ENCOUNTER — Ambulatory Visit: Payer: Managed Care, Other (non HMO) | Admitting: Family Medicine

## 2016-07-14 NOTE — Telephone Encounter (Signed)
Thanks for the message. plz call tomorrow for update on how he's doing

## 2016-07-14 NOTE — Telephone Encounter (Signed)
[facility] called - pt got an ED disposition and refused for dizziness, swollen hands and feet.  Pt also called at 1110 to cancel appt, was not able to make it on time.  TH will be sending over report

## 2016-07-14 NOTE — Telephone Encounter (Signed)
Patient Name: Phillip Evans  DOB: [birth year]    Initial Comment Caller states she has appt today for dizziness. Now hands and feet are swelling.   Nurse Assessment  Nurse: Verlin Fester RN, [facility]ney Date/Time Eilene Ghazi Time): 07/14/2016 11:35:02 AM  Confirm and document reason for call. If symptomatic, describe symptoms. ---Patient states he has dizziness and nausea. His hands and feet are swelling. He is really thirsty. He developed these symptoms after moving a lot of furniture on a hot day and thinks he might have heat exhaustion  Does the patient have any new or worsening symptoms? ---Yes  Will a triage be completed? ---Yes  Related visit to physician within the last 2 weeks? ---No  Does the PT have any chronic conditions? (i.e. diabetes, asthma, etc.) ---No  Is this a behavioral health or substance abuse call? ---No     Guidelines    Guideline Title Affirmed Question Affirmed Notes  Heat Exposure (Heat Exhaustion and Heat Stroke) [1] Dizziness or weakness AND [2] persists after 2 hours of rest and drinking liquids    Final Disposition User   Go to ED Now (or PCP triage) Verlin Fester, RN, [place] REFUSED   Disagree/Comply: Disagree  Disagree/Comply Reason: Disagree with instructions

## 2016-07-14 NOTE — Telephone Encounter (Signed)
Unable to reach pt on phone. Please seen phone note 07/14/16.

## 2016-07-14 NOTE — Telephone Encounter (Signed)
Pt is at work in [place] will go to [facility] in [place]. FYI to Dr Darnell Level.

## 2016-07-14 NOTE — Telephone Encounter (Signed)
Will await note to review.  Ok to cancel appt. Thanks.

## 2016-07-16 NOTE — Telephone Encounter (Signed)
Unable to LVM Mailbox is Full 

## 2016-07-18 NOTE — Telephone Encounter (Signed)
No answer

## 2016-07-23 NOTE — Telephone Encounter (Signed)
I reached pt on his cell. He states he no longer has the dizziness or nausea, but his hands and feet are still swollen. He also has increased heartburn. He ended up not going to UC on 6/18, but called and scheduled an appt with you for 07/29/16.

## 2016-07-29 ENCOUNTER — Ambulatory Visit: Payer: Self-pay | Admitting: Family Medicine

## 2016-12-05 ENCOUNTER — Ambulatory Visit (INDEPENDENT_AMBULATORY_CARE_PROVIDER_SITE_OTHER): Payer: 59 | Admitting: Internal Medicine

## 2016-12-05 ENCOUNTER — Encounter: Payer: Self-pay | Admitting: Internal Medicine

## 2016-12-05 VITALS — BP 124/80 | HR 70 | Temp 97.9°F | Wt 247.0 lb

## 2016-12-05 DIAGNOSIS — S161XXA Strain of muscle, fascia and tendon at neck level, initial encounter: Secondary | ICD-10-CM

## 2016-12-05 MED ORDER — METHOCARBAMOL 500 MG PO TABS: 500.0000 mg | ORAL_TABLET | Freq: Every evening | ORAL | 0 refills | Status: DC | PRN

## 2016-12-05 MED ORDER — PREDNISONE 10 MG PO TABS: ORAL_TABLET | ORAL | 0 refills | Status: DC

## 2016-12-05 NOTE — Progress Notes (Signed)
Subjective:    Patient ID: Phillip Evans, male    DOB: 1970/03/12, 46 y.o.   MRN: [id number]  HPI  Pt presents to the clinic today with c/o left neck pain. This started a few weeks. He describes the pain as sore and achy but can be sharp and stabbing. The pain is worse with movement. The pain radiates up into the left shoulder. He denies numbness, tingling or weakness. He denies any injury to the area. He has taken Advil with minimal relief.  Review of Systems      Past Medical History:  Diagnosis Date  . GERD (gastroesophageal reflux disease)   . Lactose intolerance   . Obesity     Current Outpatient Medications  Medication Sig Dispense Refill  . omeprazole (PRILOSEC OTC) 20 MG tablet Take 20 mg by mouth daily as needed.      . predniSONE (DELTASONE) 10 MG tablet Take 6 tabs day 1, 5 tabs day 2, 4 tabs day 3, 3 tabs day 4, 2 tabs day 5, 1 tab day 6 21 tablet 0   No current facility-administered medications for this visit.     Allergies  Allergen Reactions  . Oxycodone Nausea Only  . Sulfur Hives  . Tetanus Toxoids Other (See Comments)    Swollen, red arm    Family History  Problem Relation Age of Onset  . Diabetes Father   . Cancer Maternal Grandmother        stomach cancer  . Coronary artery disease Maternal Grandfather 66       CAD/MI  . Cancer Maternal Grandfather        unsure which  . Hypertension Maternal Grandfather   . ALS Maternal Grandfather   . Hypertension Paternal Grandfather     Social History   Socioeconomic History  . Marital status: Married    Spouse name: Not on file  . Number of children: 3  . Years of education: college  . Highest education level: Not on file  Social Needs  . Financial resource strain: Not on file  . Food insecurity - worry: Not on file  . Food insecurity - inability: Not on file  . Transportation needs - medical: Not on file  . Transportation needs - non-medical: Not on file  Occupational History  . Occupation:  Psychologist, occupational: OTHER  Tobacco Use  . Smoking status: Never Smoker  . Smokeless tobacco: Never Used  Substance and Sexual Activity  . Alcohol use: No    Alcohol/week: 0.0 oz  . Drug use: No  . Sexual activity: Not on file  Other Topics Concern  . Not on file  Social History Narrative   Caffeine: 4 caffeinated beverages a day   Lives with wife, 3 children, 2 cats           Constitutional: Denies fever, malaise, fatigue, headache or abrupt weight changes.  Musculoskeletal: Pt reports left shoulder and neck pain. Denies decrease in range of motion, difficulty with gait, or joint swelling.    No other specific complaints in a complete review of systems (except as listed in HPI above).  Objective:   Physical Exam  BP 124/80   Pulse 70   Temp 97.9 F (36.6 C) (Oral)   Wt 247 lb (112 kg)   SpO2 98%   BMI 36.48 kg/m  Wt Readings from Last 3 Encounters:  12/05/16 247 lb (112 kg)  10/09/15 237 lb 12 oz (107.8 kg)  09/27/15 239 lb 4  oz (108.5 kg)    General: Appears his stated age, obese in NAD. Musculoskeletal: Normal flexion, extension and rotation of the cervical spine. Pain with rotation to the right. No bony tenderness noted over the spine. Normal flexion, extension, internal and external rotation of the left shoulder. No pain with palpation of the left shoulder. Strength 5/5 BUE.  Neurological: Alert and oriented.   BMET    Component Value Date/Time   NA 139 08/28/2015 0924   K 4.5 08/28/2015 0924   CL 102 08/28/2015 0924   CO2 30 08/28/2015 0924   GLUCOSE 95 08/28/2015 0924   BUN 13 08/28/2015 0924   CREATININE 1.05 08/28/2015 0924   CALCIUM 9.5 08/28/2015 0924   GFRNONAA >60 05/22/2015 0842   GFRAA >60 05/22/2015 0842    Lipid Panel     Component Value Date/Time   CHOL 223 (H) 06/12/2010 1022   TRIG 198.0 (H) 06/12/2010 1022   HDL 43.10 06/12/2010 1022   CHOLHDL 5 06/12/2010 1022   VLDL 39.6 06/12/2010 1022    CBC    Component Value  Date/Time   WBC 6.0 08/28/2015 0924   RBC 5.00 08/28/2015 0924   HGB 14.1 08/28/2015 0924   HCT 40.8 08/28/2015 0924   PLT 211.0 08/28/2015 0924   MCV 81.7 08/28/2015 0924   MCH 28.3 05/22/2015 0842   MCHC 34.6 08/28/2015 0924   RDW 13.0 08/28/2015 0924    Hgb A1C Lab Results  Component Value Date   HGBA1C 5.4 08/28/2015            Assessment & Plan:   Muscle Strain of Left Neck:  Advil his messing up his reflux Will try low dose Pred Taper x 6 days and Methocarbamol at night- sedation caution given Heat may help Neck exercises given  Return precautions discussed Webb Silversmith, NP

## 2016-12-05 NOTE — Patient Instructions (Signed)
Neck Exercises Neck exercises can be important for many reasons:  They can help you to improve and maintain flexibility in your neck. This can be especially important as you age.  They can help to make your neck stronger. This can make movement easier.  They can reduce or prevent neck pain.  They may help your upper back.  Ask your health care provider which neck exercises would be best for you. Exercises Neck Press Repeat this exercise 10 times. Do it first thing in the morning and right before bed or as told by your health care provider. 1. Lie on your back on a firm bed or on the floor with a pillow under your head. 2. Use your neck muscles to push your head down on the pillow and straighten your spine. 3. Hold the position as well as you can. Keep your head facing up and your chin tucked. 4. Slowly count to 5 while holding this position. 5. Relax for a few seconds. Then repeat.  Isometric Strengthening Do a full set of these exercises 2 times a day or as told by your health care provider. 1. Sit in a supportive chair and place your hand on your forehead. 2. Push forward with your head and neck while pushing back with your hand. Hold for 10 seconds. 3. Relax. Then repeat the exercise 3 times. 4. Next, do thesequence again, this time putting your hand against the back of your head. Use your head and neck to push backward against the hand pressure. 5. Finally, do the same exercise on either side of your head, pushing sideways against the pressure of your hand.  Prone Head Lifts Repeat this exercise 5 times. Do this 2 times a day or as told by your health care provider. 1. Lie face-down, resting on your elbows so that your chest and upper back are raised. 2. Start with your head facing downward, near your chest. Position your chin either on or near your chest. 3. Slowly lift your head upward. Lift until you are looking straight ahead. Then continue lifting your head as far back as  you can stretch. 4. Hold your head up for 5 seconds. Then slowly lower it to your starting position.  Supine Head Lifts Repeat this exercise 8-10 times. Do this 2 times a day or as told by your health care provider. 1. Lie on your back, bending your knees to point to the ceiling and keeping your feet flat on the floor. 2. Lift your head slowly off the floor, raising your chin toward your chest. 3. Hold for 5 seconds. 4. Relax and repeat.  Scapular Retraction Repeat this exercise 5 times. Do this 2 times a day or as told by your health care provider. 1. Stand with your arms at your sides. Look straight ahead. 2. Slowly pull both shoulders backward and downward until you feel a stretch between your shoulder blades in your upper back. 3. Hold for 10-30 seconds. 4. Relax and repeat.  Contact a health care provider if:  Your neck pain or discomfort gets much worse when you do an exercise.  Your neck pain or discomfort does not improve within 2 hours after you exercise. If you have any of these problems, stop exercising right away. Do not do the exercises again unless your health care provider says that you can. Get help right away if:  You develop sudden, severe neck pain. If this happens, stop exercising right away. Do not do the exercises again unless your   health care provider says that you can. Exercises Neck Stretch  Repeat this exercise 3-5 times. 1. Do this exercise while standing or while sitting in a chair. 2. Place your feet flat on the floor, shoulder-width apart. 3. Slowly turn your head to the right. Turn it all the way to the right so you can look over your right shoulder. Do not tilt or tip your head. 4. Hold this position for 10-30 seconds. 5. Slowly turn your head to the left, to look over your left shoulder. 6. Hold this position for 10-30 seconds.  Neck Retraction Repeat this exercise 8-10 times. Do this 3-4 times a day or as told by your health care  provider. 1. Do this exercise while standing or while sitting in a sturdy chair. 2. Look straight ahead. Do not bend your neck. 3. Use your fingers to push your chin backward. Do not bend your neck for this movement. Continue to face straight ahead. If you are doing the exercise properly, you will feel a slight sensation in your throat and a stretch at the back of your neck. 4. Hold the stretch for 1-2 seconds. Relax and repeat.  This information is not intended to replace advice given to you by your health care provider. Make sure you discuss any questions you have with your health care provider. Document Released: 12/25/2014 Document Revised: 06/21/2015 Document Reviewed: 07/24/2014 Elsevier Interactive Patient Education  2017 [employer]  

## 2016-12-29 ENCOUNTER — Ambulatory Visit: Payer: 59 | Admitting: Family Medicine

## 2016-12-29 ENCOUNTER — Encounter: Payer: Self-pay | Admitting: Family Medicine

## 2016-12-29 ENCOUNTER — Ambulatory Visit (INDEPENDENT_AMBULATORY_CARE_PROVIDER_SITE_OTHER)
Admission: RE | Admit: 2016-12-29 | Discharge: 2016-12-29 | Disposition: A | Discharge status: Home / Self Care | Payer: 59 | Source: Ambulatory Visit | Attending: Family Medicine | Admitting: Family Medicine

## 2016-12-29 VITALS — Temp 98.0°F | Wt 246.5 lb

## 2016-12-29 DIAGNOSIS — R55 Syncope and collapse: Secondary | ICD-10-CM | POA: Diagnosis not present

## 2016-12-29 DIAGNOSIS — E538 Deficiency of other specified B group vitamins: Secondary | ICD-10-CM

## 2016-12-29 DIAGNOSIS — M542 Cervicalgia: Secondary | ICD-10-CM | POA: Diagnosis not present

## 2016-12-29 LAB — BASIC METABOLIC PANEL
BUN: 17 mg/dL (ref 6–23)
CO2: 29 mEq/L (ref 19–32)
CREATININE: 0.96 mg/dL (ref 0.40–1.50)
Calcium: 9.8 mg/dL (ref 8.4–10.5)
Chloride: 103 mEq/L (ref 96–112)
GFR: 90.44 mL/min (ref >60.00)
Glucose, Bld: 111 mg/dL — ABNORMAL HIGH (ref 70–99)
Potassium: 4.7 mEq/L (ref 3.5–5.1)
Sodium: 141 mEq/L (ref 135–145)

## 2016-12-29 LAB — CBC WITH DIFFERENTIAL/PLATELET
BASOS PCT: 0.3 % (ref 0.0–3.0)
Basophils Absolute: 0 10*3/uL (ref 0.0–0.1)
Eosinophils Absolute: 0.1 10*3/uL (ref 0.0–0.7)
Eosinophils Relative: 1.4 % (ref 0.0–5.0)
HEMATOCRIT: 41.5 % (ref 39.0–52.0)
Hemoglobin: 14.2 g/dL (ref 13.0–17.0)
LYMPHS PCT: 32.8 % (ref 12.0–46.0)
Lymphs Abs: 1.9 10*3/uL (ref 0.7–4.0)
MCHC: 34.2 g/dL (ref 30.0–36.0)
MCV: 83.1 fl (ref 78.0–100.0)
MONOS PCT: 6.4 % (ref 3.0–12.0)
Monocytes Absolute: 0.4 10*3/uL (ref 0.1–1.0)
Neutro Abs: 3.4 10*3/uL (ref 1.4–7.7)
Neutrophils Relative %: 59.1 % (ref 43.0–77.0)
PLATELETS: 211 10*3/uL (ref 150.0–400.0)
RBC: 5 Mil/uL (ref 4.22–5.81)
RDW: 13.3 % (ref 11.5–15.5)
WBC: 5.8 10*3/uL (ref 4.0–10.5)

## 2016-12-29 LAB — TSH: TSH: 2.67 u[IU]/mL (ref 0.35–4.50)

## 2016-12-29 LAB — VITAMIN B12: Vitamin B-12: 198 pg/mL — ABNORMAL LOW (ref 211–911)

## 2016-12-29 MED ORDER — CYCLOBENZAPRINE HCL 10 MG PO TABS: 10.0000 mg | ORAL_TABLET | Freq: Two times a day (BID) | ORAL | 0 refills | Status: DC | PRN

## 2016-12-29 NOTE — Patient Instructions (Addendum)
Neck xray today. We will refer you to physical therapy. Continue heat May try flexeril muscle relaxant as needed - also take at night, don't drive.  Labs today.  Good to see you today, call us with questions.

## 2016-12-29 NOTE — Progress Notes (Signed)
BP 118/80 (BP Location: Left Arm, Patient Position: Sitting, Cuff Size: Large)   Pulse 72   Temp 98 F (36.7 C) (Oral)   Wt 246 lb 8 oz (111.8 kg)   SpO2 98%   BMI 36.40 kg/m    CC: L shoulder pain, presyncope Subjective:    Patient ID: Phillip Evans, male    DOB: 06-19-1970, 46 y.o.   MRN: [id number]  HPI: Phillip Evans is a 46 y.o. male presenting on 12/29/2016 for Shoulder Pain (left, follow-up. Constant ache. Still having pain. Has taken prednisone and muscle relaxer, not helping); syncope (Has had feelings of blacking out. First happened about 4 mo ago. 2 episodes); and Eye Problem (twitching eyelids)   Seen earlier this month by [facility] with dx left neck muscle strain treated with low dose prednisone and methocarbamol, neck stretching exercises. This provided very limited relief. Heat did help. NSAIDs worsened GERD. Pain starts posterior neck and travels down L shoulder into upper arm. This started 2 months ago. Denies inciting trauma/injury, fall. Denies paresthesias of arms.   Also endorses 2 episodes of pre-syncope in the past 4 months "feel vision blacking out" without LOC. First episode occurred while getting out of bed after dinner. Second episode occurred at work while bending over. No symptoms while exertion. Denies headaches, palpitations, chest pain or shortness of breath, nausea. Mild dizziness.   Ongoing swelling of extremities noted over last several years.   GERD controlled with omeprazole 20mg  OTC daily. Triggers are certain foods and caffeine.  Relevant past medical, surgical, family and social history reviewed and updated as indicated. Interim medical history since our last visit reviewed. Allergies and medications reviewed and updated. Outpatient Medications Prior to Visit  Medication Sig Dispense Refill  . omeprazole (PRILOSEC OTC) 20 MG tablet Take 20 mg by mouth daily as needed.      . methocarbamol (ROBAXIN) 500 MG tablet Take 1 tablet (500 mg total) at  bedtime as needed by mouth for muscle spasms. 15 tablet 0  . predniSONE (DELTASONE) 10 MG tablet Take 3 tabs on days 1-2, take 2 tabs on days 3-4, take 1 tab on days 5-6 12 tablet 0   No facility-administered medications prior to visit.      Per HPI unless specifically indicated in ROS section below Review of Systems     Objective:    BP 118/80 (BP Location: Left Arm, Patient Position: Sitting, Cuff Size: Large)   Pulse 72   Temp 98 F (36.7 C) (Oral)   Wt 246 lb 8 oz (111.8 kg)   SpO2 98%   BMI 36.40 kg/m   Wt Readings from Last 3 Encounters:  12/29/16 246 lb 8 oz (111.8 kg)  12/05/16 247 lb (112 kg)  10/09/15 237 lb 12 oz (107.8 kg)    Physical Exam  Constitutional: He is oriented to person, place, and time. He appears well-developed and well-nourished. No distress.  HENT:  Head: Normocephalic and atraumatic.  Mouth/Throat: Oropharynx is clear and moist. No oropharyngeal exudate.  Eyes: Conjunctivae and EOM are normal. Pupils are equal, round, and reactive to light.  Neck: Normal range of motion. Neck supple. No thyromegaly present.  Cardiovascular: Normal rate, regular rhythm and intact distal pulses.  Murmur (2/6 systolic at sternal border) heard. Pulmonary/Chest: Effort normal and breath sounds normal. No respiratory distress. He has no wheezes. He has no rales.  Genitourinary: Prostate normal.  Musculoskeletal: He exhibits no edema.  FROM at neck with discomfort with lateral rotation and flexion R  shoulder WNL L shoulder exam: No deformity of shoulders on inspection. No pain with palpation of shoulder landmarks. FROM in abduction and forward flexion. No pain or weakness with testing SITS in ext/int rotation. No pain with empty can sign. Neg Speed test. No impingement. No pain with crossover test. No pain with rotation of humeral head in [place] joint.   Lymphadenopathy:    He has no cervical adenopathy.  Neurological: He is alert and oriented to person, place, and  time. He has normal strength. No sensory deficit.  5/5 strength BUE Sensation intact Neg spurling  Skin: Skin is warm and dry. No rash noted.  Psychiatric: He has a normal mood and affect.  Nursing note and vitals reviewed.  Lab Results  Component Value Date   WBC 6.0 08/28/2015   HGB 14.1 08/28/2015   HCT 40.8 08/28/2015   MCV 81.7 08/28/2015   PLT 211.0 08/28/2015    Lab Results  Component Value Date   HGBA1C 5.4 08/28/2015    Lab Results  Component Value Date   CHOL 223 (H) 06/12/2010   HDL 43.10 06/12/2010   LDLDIRECT 160.3 06/12/2010   TRIG 198.0 (H) 06/12/2010   CHOLHDL 5 06/12/2010    Lab Results  Component Value Date   CREATININE 1.05 08/28/2015       Assessment & Plan:   Problem List Items Addressed This Visit    B12 deficiency    Likely PPI related. Update b12 levels.       Relevant Orders   Vitamin B12   Neck pain on left side - Primary    Benign exam today. Anticipate cervical strain/spasm, r/o cervical radiculopathy. Non focal neurological exam. Given duration of symptoms, check cervical films today. Will refer to PT for further treatment. Will Rx flexeril muscle relaxant, continue heat. Update with effect.      Relevant Orders   DG Cervical Spine Complete   Ambulatory referral to Physical Therapy   Pre-syncope    Benign exam today, mild murmur heard - will continue to monitor.  Anticipate orthostatic - check vitals today. Encouraged good hydration status.  Update if ongoing or worsening symptoms.       Relevant Orders   TSH   CBC with Differential/Platelet   Basic metabolic panel       Follow up plan: No Follow-up on file.  Ria Bush, MD

## 2016-12-29 NOTE — Assessment & Plan Note (Addendum)
Likely PPI related. Update b12 levels.

## 2016-12-29 NOTE — Assessment & Plan Note (Signed)
Benign exam today, mild murmur heard - will continue to monitor.  Anticipate orthostatic - check vitals today. Encouraged good hydration status.  Update if ongoing or worsening symptoms.

## 2016-12-29 NOTE — Assessment & Plan Note (Signed)
Benign exam today. Anticipate cervical strain/spasm, r/o cervical radiculopathy. Non focal neurological exam. Given duration of symptoms, check cervical films today. Will refer to PT for further treatment. Will Rx flexeril muscle relaxant, continue heat. Update with effect.

## 2016-12-31 ENCOUNTER — Other Ambulatory Visit: Payer: Self-pay | Admitting: Family Medicine

## 2016-12-31 MED ORDER — VITAMIN B-12 1000 MCG PO TABS: 1000.0000 ug | ORAL_TABLET | Freq: Every day | ORAL | Status: DC

## 2017-06-24 ENCOUNTER — Encounter: Payer: Self-pay | Admitting: Family Medicine

## 2017-06-24 ENCOUNTER — Ambulatory Visit: Payer: Managed Care, Other (non HMO) | Admitting: Family Medicine

## 2017-06-24 VITALS — BP 124/78 | HR 61 | Temp 97.8°F | Ht 69.0 in | Wt 238.8 lb

## 2017-06-24 DIAGNOSIS — M722 Plantar fascial fibromatosis: Secondary | ICD-10-CM

## 2017-06-24 MED ORDER — MELOXICAM 15 MG PO TABS: ORAL_TABLET | ORAL | 0 refills | Status: DC

## 2017-06-24 NOTE — Patient Instructions (Signed)
Try frozen water bottle for pain/swelling Do exercises daily as you can Avoid activities that make pain worse   Plantar Fasciitis Plantar fasciitis is a painful foot condition that affects the heel. It occurs when the band of tissue that connects the toes to the heel bone (plantar fascia) becomes irritated. This can happen after exercising too much or doing other repetitive activities (overuse injury). The pain from plantar fasciitis can range from mild irritation to severe pain that makes it difficult for you to walk or move. The pain is usually worse in the morning or after you have been sitting or lying down for a while. What are the causes? This condition may be caused by:  Standing for long periods of time.  Wearing shoes that do not fit.  Doing high-impact activities, including running, aerobics, and ballet.  Being overweight.  Having an abnormal way of walking (gait).  Having tight calf muscles.  Having high arches in your feet.  Starting a new athletic activity.  What are the signs or symptoms? The main symptom of this condition is heel pain. Other symptoms include:  Pain that gets worse after activity or exercise.  Pain that is worse in the morning or after resting.  Pain that goes away after you walk for a few minutes.  How is this diagnosed? This condition may be diagnosed based on your signs and symptoms. Your health care provider will also do a physical exam to check for:  A tender area on the bottom of your foot.  A high arch in your foot.  Pain when you move your foot.  Difficulty moving your foot.  You may also need to have imaging studies to confirm the diagnosis. These can include:  X-rays.  Ultrasound.  MRI.  How is this treated? Treatment for plantar fasciitis depends on the severity of the condition. Your treatment may include:  Rest, ice, and over-the-counter pain medicines to manage your pain.  Exercises to stretch your calves and your  plantar fascia.  A splint that holds your foot in a stretched, upward position while you sleep (night splint).  Physical therapy to relieve symptoms and prevent problems in the future.  Cortisone injections to relieve severe pain.  Extracorporeal shock wave therapy (ESWT) to stimulate damaged plantar fascia with electrical impulses. It is often used as a last resort before surgery.  Surgery, if other treatments have not worked after 12 months.  Follow these instructions at home:  Take medicines only as directed by your health care provider.  Avoid activities that cause pain.  Roll the bottom of your foot over a bag of ice or a bottle of cold water. Do this for 20 minutes, 3-4 times a day.  Perform simple stretches as directed by your health care provider.  Try wearing athletic shoes with air-sole or gel-sole cushions or soft shoe inserts.  Wear a night splint while sleeping, if directed by your health care provider.  Keep all follow-up appointments with your health care provider. How is this prevented?  Do not perform exercises or activities that cause heel pain.  Consider finding low-impact activities if you continue to have problems.  Lose weight if you need to. The best way to prevent plantar fasciitis is to avoid the activities that aggravate your plantar fascia. Contact a health care provider if:  Your symptoms do not go away after treatment with home care measures.  Your pain gets worse.  Your pain affects your ability to move or do your daily  activities. This information is not intended to replace advice given to you by your health care provider. Make sure you discuss any questions you have with your health care provider. Document Released: 10/08/2000 Document Revised: 06/18/2015 Document Reviewed: 11/23/2013 Elsevier Interactive Patient Education  2018 [employer].   Plantar Fasciitis Rehab Ask your health care provider which exercises are safe for you. Do  exercises exactly as told by your health care provider and adjust them as directed. It is normal to feel mild stretching, pulling, tightness, or discomfort as you do these exercises, but you should stop right away if you feel sudden pain or your pain gets worse. Do not begin these exercises until told by your health care provider. Stretching and range of motion exercises These exercises warm up your muscles and joints and improve the movement and flexibility of your foot. These exercises also help to relieve pain. Exercise A: Plantar fascia stretch  1. Sit with your left / right leg crossed over your opposite knee. 2. Hold your heel with one hand with that thumb near your arch. With your other hand, hold your toes and gently pull them back toward the top of your foot. You should feel a stretch on the bottom of your toes or your foot or both. 3. Hold this stretch for__________ seconds. 4. Slowly release your toes and return to the starting position. Repeat __________ times. Complete this exercise __________ times a day. Exercise B: Gastroc, standing  1. Stand with your hands against a wall. 2. Extend your left / right leg behind you, and bend your front knee slightly. 3. Keeping your heels on the floor and keeping your back knee straight, shift your weight toward the wall without arching your back. You should feel a gentle stretch in your left / right calf. 4. Hold this position for __________ seconds. Repeat __________ times. Complete this exercise __________ times a day. Exercise C: Soleus, standing 1. Stand with your hands against a wall. 2. Extend your left / right leg behind you, and bend your front knee slightly. 3. Keeping your heels on the floor, bend your back knee and slightly shift your weight over the back leg. You should feel a gentle stretch deep in your calf. 4. Hold this position for __________ seconds. Repeat __________ times. Complete this exercise __________ times a  day. Exercise D: Gastrocsoleus, standing 1. Stand with the ball of your left / right foot on a step. The ball of your foot is on the walking surface, right under your toes. 2. Keep your other foot firmly on the same step. 3. Hold onto the wall or a railing for balance. 4. Slowly lift your other foot, allowing your body weight to press your heel down over the edge of the step. You should feel a stretch in your left / right calf. 5. Hold this position for __________ seconds. 6. Return both feet to the step. 7. Repeat this exercise with a slight bend in your left / right knee. Repeat __________ times with your left / right knee straight and __________ times with your left / right knee bent. Complete this exercise __________ times a day. Balance exercise This exercise builds your balance and strength control of your arch to help take pressure off your plantar fascia. Exercise E: Single leg stand 1. Without shoes, stand near a railing or in a doorway. You may hold onto the railing or door frame as needed. 2. Stand on your left / right foot. Keep your big toe  down on the floor and try to keep your arch lifted. Do not let your foot roll inward. 3. Hold this position for __________ seconds. 4. If this exercise is too easy, you can try it with your eyes closed or while standing on a pillow. Repeat __________ times. Complete this exercise __________ times a day. This information is not intended to replace advice given to you by your health care provider. Make sure you discuss any questions you have with your health care provider. Document Released: 01/13/2005 Document Revised: 09/18/2015 Document Reviewed: 11/27/2014 Elsevier Interactive Patient Education  2018 [employer].

## 2017-06-24 NOTE — Progress Notes (Signed)
   Subjective:    Patient ID: Phillip Evans, male    DOB: 28-Aug-1970, 47 y.o.   MRN: [id number]  HPI This is a 47 yo male who presents today with right foot pain x 2 weeks. Was working out at [employer] and added running. Woke up the next morning and both feet were very painful. Now right foot only one that hurts, pain as day goes on. Worse pain with dress shoes. Has taken advil 600 mg once a day with some relief. Doesn't remember to take more than once a day.   Past Medical History:  Diagnosis Date  . GERD (gastroesophageal reflux disease)   . Lactose intolerance   . Obesity    Past Surgical History:  Procedure Laterality Date  . ESOPHAGOGASTRODUODENOSCOPY  2000   WNL per pt  . R hand surgery  1994   with tendon reconstruction   Family History  Problem Relation Age of Onset  . Diabetes Father   . Cancer Maternal Grandmother        stomach cancer  . Coronary artery disease Maternal Grandfather 36       CAD/MI  . Cancer Maternal Grandfather        unsure which  . Hypertension Maternal Grandfather   . ALS Maternal Grandfather   . Hypertension Paternal Grandfather    Social History   Tobacco Use  . Smoking status: Never Smoker  . Smokeless tobacco: Never Used  Substance Use Topics  . Alcohol use: No    Alcohol/week: 0.0 oz  . Drug use: No      Review of Systems Per HPI    Objective:   Physical Exam  Constitutional: He is oriented to person, place, and time. He appears well-developed and well-nourished.  HENT:  Head: Normocephalic and atraumatic.  Eyes: Conjunctivae are normal.  Cardiovascular: Normal rate.  Pulmonary/Chest: Effort normal.  Musculoskeletal:       Right foot: There is tenderness. There is normal range of motion, no bony tenderness, no swelling and normal capillary refill.       Feet:  Neurological: He is alert and oriented to person, place, and time.  Skin: Skin is warm and dry.  Vitals reviewed.     BP 124/78 (BP Location: Left Arm,  Patient Position: Sitting, Cuff Size: Large)   Pulse 61   Temp 97.8 F (36.6 C) (Oral)   Ht 5\' 9"  (1.753 m)   Wt 238 lb 12 oz (108.3 kg)   SpO2 98%   BMI 35.26 kg/m  Wt Readings from Last 3 Encounters:  06/24/17 238 lb 12 oz (108.3 kg)  12/29/16 246 lb 8 oz (111.8 kg)  12/05/16 247 lb (112 kg)       Assessment & Plan:  1. Plantar fasciitis, right -Provided written and verbal information regarding diagnosis and treatment including exercises/icing/avoiding triggers - Follow up if no improvement in 2 weeks or if worsening- sports med/podiatry - meloxicam (MOBIC) 15 MG tablet; Take daily for 7-10 days then daily as needed  Dispense: 30 tablet; Refill: 0  Clarene Reamer, FNP-BC  [facility], [facility]  06/24/2017 9:25 AM

## 2017-07-26 ENCOUNTER — Other Ambulatory Visit: Payer: Self-pay | Admitting: Family Medicine

## 2017-07-26 DIAGNOSIS — M722 Plantar fascial fibromatosis: Secondary | ICD-10-CM

## 2017-07-27 NOTE — Telephone Encounter (Signed)
PCP is Dr. Darnell Level

## 2017-07-27 NOTE — Telephone Encounter (Signed)
Meloxicam Last filled:  06/24/17, #30 Last OV: 06/24/17, acute with Debbie Next OV:  none

## 2017-09-16 ENCOUNTER — Ambulatory Visit: Payer: Managed Care, Other (non HMO) | Admitting: Family Medicine

## 2017-12-21 NOTE — Progress Notes (Deleted)
There were no vitals taken for this visit.   CC: *** Subjective:    Patient ID: Phillip Evans, male    DOB: 1970-03-21, 47 y.o.   MRN: [id number]  HPI: Phillip Evans is a 47 y.o. male presenting on 12/22/2017 for No chief complaint on file.   ***  Relevant past medical, surgical, family and social history reviewed and updated as indicated. Interim medical history since our last visit reviewed. Allergies and medications reviewed and updated. Outpatient Medications Prior to Visit  Medication Sig Dispense Refill  . cyclobenzaprine (FLEXERIL) 10 MG tablet Take 1 tablet (10 mg total) by mouth 2 (two) times daily as needed for muscle spasms. 30 tablet 0  . meloxicam (MOBIC) 15 MG tablet TAKE 1 TABLET BY MOUTH EVERY DAY FOR 7-10 DAYS THEN DAILY AS NEEDED 30 tablet 0  . omeprazole (PRILOSEC OTC) 20 MG tablet Take 20 mg by mouth daily as needed.      . vitamin B-12 (CYANOCOBALAMIN) 1000 MCG tablet Take 1 tablet (1,000 mcg total) by mouth daily.     No facility-administered medications prior to visit.      Per HPI unless specifically indicated in ROS section below Review of Systems     Objective:    There were no vitals taken for this visit.  Wt Readings from Last 3 Encounters:  06/24/17 238 lb 12 oz (108.3 kg)  12/29/16 246 lb 8 oz (111.8 kg)  12/05/16 247 lb (112 kg)    Physical Exam Results for orders placed or performed in visit on 12/29/16  Vitamin B12  Result Value Ref Range   Vitamin B-12 198 (L) 211 - 911 pg/mL  TSH  Result Value Ref Range   TSH 2.67 0.35 - 4.50 uIU/mL  CBC with Differential/Platelet  Result Value Ref Range   WBC 5.8 4.0 - 10.5 K/uL   RBC 5.00 4.22 - 5.81 Mil/uL   Hemoglobin 14.2 13.0 - 17.0 g/dL   HCT 41.5 39.0 - 52.0 %   MCV 83.1 78.0 - 100.0 fl   MCHC 34.2 30.0 - 36.0 g/dL   RDW 13.3 11.5 - 15.5 %   Platelets 211.0 150.0 - 400.0 K/uL   Neutrophils Relative % 59.1 43.0 - 77.0 %   Lymphocytes Relative 32.8 12.0 - 46.0 %   Monocytes Relative  6.4 3.0 - 12.0 %   Eosinophils Relative 1.4 0.0 - 5.0 %   Basophils Relative 0.3 0.0 - 3.0 %   Neutro Abs 3.4 1.4 - 7.7 K/uL   Lymphs Abs 1.9 0.7 - 4.0 K/uL   Monocytes Absolute 0.4 0.1 - 1.0 K/uL   Eosinophils Absolute 0.1 0.0 - 0.7 K/uL   Basophils Absolute 0.0 0.0 - 0.1 K/uL  Basic metabolic panel  Result Value Ref Range   Sodium 141 135 - 145 mEq/L   Potassium 4.7 3.5 - 5.1 mEq/L   Chloride 103 96 - 112 mEq/L   CO2 29 19 - 32 mEq/L   Glucose, Bld 111 (H) 70 - 99 mg/dL   BUN 17 6 - 23 mg/dL   Creatinine, Ser 0.96 0.40 - 1.50 mg/dL   Calcium 9.8 8.4 - 10.5 mg/dL   GFR 90.44 >60.00 mL/min      Assessment & Plan:   Problem List Items Addressed This Visit    None       No orders of the defined types were placed in this encounter.  No orders of the defined types were placed in this encounter.   Follow up  plan: No follow-ups on file.  Ria Bush, MD

## 2017-12-22 ENCOUNTER — Ambulatory Visit: Payer: Self-pay | Admitting: Family Medicine

## 2017-12-27 NOTE — Progress Notes (Signed)
BP 118/68 (BP Location: Left Arm, Patient Position: Sitting, Cuff Size: Large)   Pulse 72   Temp 98 F (36.7 C) (Oral)   Ht 5\' 9"  (1.753 m)   Wt 245 lb 4 oz (111.2 kg)   SpO2 97%   BMI 36.22 kg/m    CC: R elbow pain Subjective:    Patient ID: Phillip Evans, male    DOB: September 16, 1970, 47 y.o.   MRN: [id number]  HPI: Phillip Evans is a 47 y.o. male presenting on 12/28/2017 for Elbow Pain (C/o right elbow pain for 5 mos due to an injury on a rowing machine. Pain is worsening. Tried Advil, aspirin and heat/cold therapy. Helped at first. )   Back in July 2019 injured R elbow while working on row machine. Noticed pain in lateral R elbow, progressively worsening despite treating with advil 800mg  bid, aspirin, ice, heat. Using tennis elbow brace as well as copper sleeve. Worse pain at night. Worsening especially noted the last few weeks. Trouble lifting heavy material.   Right handed.   Relevant past medical, surgical, family and social history reviewed and updated as indicated. Interim medical history since our last visit reviewed. Allergies and medications reviewed and updated. Outpatient Medications Prior to Visit  Medication Sig Dispense Refill  . cyclobenzaprine (FLEXERIL) 10 MG tablet Take 1 tablet (10 mg total) by mouth 2 (two) times daily as needed for muscle spasms. 30 tablet 0  . meloxicam (MOBIC) 15 MG tablet TAKE 1 TABLET BY MOUTH EVERY DAY FOR 7-10 DAYS THEN DAILY AS NEEDED 30 tablet 0  . omeprazole (PRILOSEC OTC) 20 MG tablet Take 20 mg by mouth daily as needed.      . vitamin B-12 (CYANOCOBALAMIN) 1000 MCG tablet Take 1 tablet (1,000 mcg total) by mouth daily.     No facility-administered medications prior to visit.      Per HPI unless specifically indicated in ROS section below Review of Systems     Objective:    BP 118/68 (BP Location: Left Arm, Patient Position: Sitting, Cuff Size: Large)   Pulse 72   Temp 98 F (36.7 C) (Oral)   Ht 5\' 9"  (1.753 m)   Wt 245 lb 4  oz (111.2 kg)   SpO2 97%   BMI 36.22 kg/m   Wt Readings from Last 3 Encounters:  12/28/17 245 lb 4 oz (111.2 kg)  06/24/17 238 lb 12 oz (108.3 kg)  12/29/16 246 lb 8 oz (111.8 kg)    Physical Exam  Constitutional: He appears well-developed and well-nourished. No distress.  Musculoskeletal: He exhibits no edema.  2+ rad pulses bilaterally Point tender at lateral epicondyle on right Weakness due to pain with forced supination and wrist flexion against resistance  Skin: Skin is warm and dry. No erythema.  Nursing note and vitals reviewed.     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 Discussed importance of elbow rest, discussed correct use of tennis elbow strap (he had not been using correctly). Provided with exercises from [facility] pt advisor. Treat with prednisone taper then naprosyn course. Offered ortho referral for further eval/treatment. Pt agrees.       Relevant Medications   predniSONE (DELTASONE) 20 MG tablet   naproxen (NAPROSYN) 500 MG tablet   Other Relevant Orders   Ambulatory referral to Orthopedic Surgery       Meds  ordered this encounter  Medications  . predniSONE (DELTASONE) 20 MG tablet    Sig: Take two tablets daily for 3 days followed by one tablet daily for 4 days    Dispense:  10 tablet    Refill:  0  . naproxen (NAPROSYN) 500 MG tablet    Sig: Take one po bid x 1 week then prn pain, take with food    Dispense:  60 tablet    Refill:  0   Orders Placed This Encounter  Procedures  . Ambulatory referral to Orthopedic Surgery    Referral Priority:   Routine    Referral Type:   Surgical    Referral Reason:   Specialty Services Required    Requested Specialty:   Orthopedic Surgery    Number of Visits Requested:   1    Follow up plan: Return if symptoms worsen or fail to improve.  Ria Bush, MD

## 2017-12-28 ENCOUNTER — Encounter: Payer: Self-pay | Admitting: Family Medicine

## 2017-12-28 ENCOUNTER — Ambulatory Visit: Payer: Managed Care, Other (non HMO) | Admitting: Family Medicine

## 2017-12-28 VITALS — BP 118/68 | HR 72 | Temp 98.0°F | Ht 69.0 in | Wt 245.2 lb

## 2017-12-28 DIAGNOSIS — M7711 Lateral epicondylitis, right elbow: Secondary | ICD-10-CM | POA: Insufficient documentation

## 2017-12-28 MED ORDER — PREDNISONE 20 MG PO TABS: ORAL_TABLET | ORAL | 0 refills | Status: DC

## 2017-12-28 MED ORDER — NAPROXEN 500 MG PO TABS: ORAL_TABLET | ORAL | 0 refills | Status: DC

## 2017-12-28 NOTE — Patient Instructions (Signed)
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 Rest arm as able, continue ice/heat whichever soothes better. Do prednisone taper then naprosyn anti inflammatory course with meals. Do exercises provided today. Use tennis elbow strap. We will refer you to orthopedics for further evaluation.

## 2017-12-28 NOTE — Assessment & Plan Note (Addendum)
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 Discussed importance of elbow rest, discussed correct use of tennis elbow strap (he had not been using correctly). Provided with exercises from [facility] pt advisor. Treat with prednisone taper then naprosyn course. Offered ortho referral for further eval/treatment. Pt agrees.

## 2018-02-28 ENCOUNTER — Other Ambulatory Visit: Payer: Self-pay | Admitting: Family Medicine

## 2018-03-23 ENCOUNTER — Telehealth: Payer: Self-pay | Admitting: *Deleted

## 2018-03-23 MED ORDER — SCOPOLAMINE 1 MG/3DAYS TD PT72: 1.0000 | MEDICATED_PATCH | TRANSDERMAL | 0 refills | Status: DC

## 2018-03-23 NOTE — Telephone Encounter (Signed)
Patient called stating that he is going on a cruise and has a history of motion sickness. Patient stated that he would like some of the patches sent to the pharmacy for motion sickness to have if he needs them. Pharmacy [facility]

## 2018-03-23 NOTE — Telephone Encounter (Signed)
Spoke with pt notifying him rx for patch was sent in.  Pt expresses his thanks.

## 2018-03-23 NOTE — Telephone Encounter (Signed)
plz notify this was sent in. 

## 2018-05-06 ENCOUNTER — Encounter: Payer: Self-pay | Admitting: Family Medicine

## 2018-05-06 ENCOUNTER — Ambulatory Visit (INDEPENDENT_AMBULATORY_CARE_PROVIDER_SITE_OTHER): Payer: Managed Care, Other (non HMO) | Admitting: Family Medicine

## 2018-05-06 VITALS — Ht 69.0 in | Wt 235.0 lb

## 2018-05-06 DIAGNOSIS — J029 Acute pharyngitis, unspecified: Secondary | ICD-10-CM | POA: Insufficient documentation

## 2018-05-06 DIAGNOSIS — E538 Deficiency of other specified B group vitamins: Secondary | ICD-10-CM | POA: Diagnosis not present

## 2018-05-06 DIAGNOSIS — H00024 Hordeolum internum left upper eyelid: Secondary | ICD-10-CM | POA: Insufficient documentation

## 2018-05-06 DIAGNOSIS — Z87898 Personal history of other specified conditions: Secondary | ICD-10-CM

## 2018-05-06 DIAGNOSIS — H00014 Hordeolum externum left upper eyelid: Secondary | ICD-10-CM | POA: Diagnosis not present

## 2018-05-06 MED ORDER — ERYTHROMYCIN 5 MG/GM OP OINT: 1.0000 "application " | TOPICAL_OINTMENT | Freq: Every day | OPHTHALMIC | 0 refills | Status: DC

## 2018-05-06 NOTE — Assessment & Plan Note (Signed)
Encouraged ongoing b12 supplementation.

## 2018-05-06 NOTE — Assessment & Plan Note (Signed)
Story/exam most consistent with this. Supportive care reviewed with topical warm compresses, ibuprofen. Discussed indications to use erythromycin ointment - sent in in case needed. Update if worsening symptoms or not improving in 1-2 wks for eye doctor evaluation. Pt agrees with plan.

## 2018-05-06 NOTE — Progress Notes (Signed)
Virtual visit completed through [employer]. Due to national recommendations of social distancing due to [place] 19, a virtual visit is felt to be most appropriate for this patient at this time.   Patient location: home Provider location: [place] at [facility], office If any vitals were documented, they were collected by patient at home unless specified below.    Ht 5\' 9"  (1.753 m)   Wt 235 lb (106.6 kg)   BMI 34.70 kg/m    CC: L eye swelling Subjective:    Patient ID: Phillip Evans, male    DOB: [birth year], 48 y.o.   MRN: [id number]  HPI: Phillip Evans is a 48 y.o. male presenting on 05/06/2018 for Eye Problem (C/o left eyelid swelling. Started 05/03/18. Area is sore to touch and red. Says he had a bump on eye last week. Has breakouts when he sweats. ) and Sore Throat (C/o sore throat for about 3 wks until 05/04/18. Thinks it was due to allergies. Taking Zyrtec. )   L upper eyelid swelling started Monday this week. Did note bump on outer eyelid. Hasn't tried anything for this other than peroxide. He did pop the lump. Has been using ibuprofen some. No vision changes. No red to white of eye.   ST ongoing for last 3 weeks - but seems to be clearing up - none in the last 2 days. Attributed to allergies - started after cruise. Treated with tylenol/ibuprofen.      Relevant past medical, surgical, family and social history reviewed and updated as indicated. Interim medical history since our last visit reviewed. Allergies and medications reviewed and updated. Outpatient Medications Prior to Visit  Medication Sig Dispense Refill  . omeprazole (PRILOSEC OTC) 20 MG tablet Take 20 mg by mouth daily.     . meloxicam (MOBIC) 15 MG tablet TAKE 1 TABLET BY MOUTH EVERY DAY FOR 7-10 DAYS THEN DAILY AS NEEDED 30 tablet 0  . naproxen (NAPROSYN) 500 MG tablet TAKE ONE BY MOUTH TWICE A DAY X 1 WEEK THEN AS NEEDED PAIN, TAKE WITH FOOD 60 tablet 0  . vitamin B-12 (CYANOCOBALAMIN) 1000 MCG tablet Take 1 tablet  (1,000 mcg total) by mouth daily. (Patient not taking: Reported on 05/06/2018)    . cyclobenzaprine (FLEXERIL) 10 MG tablet Take 1 tablet (10 mg total) by mouth 2 (two) times daily as needed for muscle spasms. 30 tablet 0  . predniSONE (DELTASONE) 20 MG tablet Take two tablets daily for 3 days followed by one tablet daily for 4 days 10 tablet 0  . scopolamine (TRANSDERM-SCOP, 1.5 MG,) 1 MG/3DAYS Place 1 patch (1.5 mg total) onto the skin every 3 (three) days. As needed for motion sickness 10 patch 0   No facility-administered medications prior to visit.      Per HPI unless specifically indicated in ROS section below Review of Systems Objective:    Ht 5\' 9"  (1.753 m)   Wt 235 lb (106.6 kg)   BMI 34.70 kg/m   Wt Readings from Last 3 Encounters:  05/06/18 235 lb (106.6 kg)  12/28/17 245 lb 4 oz (111.2 kg)  06/24/17 238 lb 12 oz (108.3 kg)     Physical exam: Gen: alert, NAD, not ill appearing Eye: R eye WNL. L upper eyelid swelling without obvious erythema or lesion. Conjunctiva clear without injection. EOMI Pulm: speaks in complete sentences without increased work of breathing Psych: normal mood, normal thought content      Results for orders placed or performed in visit on 12/29/16  Vitamin B12  Result Value Ref Range   Vitamin B-12 198 (L) 211 - 911 pg/mL  TSH  Result Value Ref Range   TSH 2.67 0.35 - 4.50 uIU/mL  CBC with Differential/Platelet  Result Value Ref Range   WBC 5.8 4.0 - 10.5 K/uL   RBC 5.00 4.22 - 5.81 Mil/uL   Hemoglobin 14.2 13.0 - 17.0 g/dL   HCT 41.5 39.0 - 52.0 %   MCV 83.1 78.0 - 100.0 fl   MCHC 34.2 30.0 - 36.0 g/dL   RDW 13.3 11.5 - 15.5 %   Platelets 211.0 150.0 - 400.0 K/uL   Neutrophils Relative % 59.1 43.0 - 77.0 %   Lymphocytes Relative 32.8 12.0 - 46.0 %   Monocytes Relative 6.4 3.0 - 12.0 %   Eosinophils Relative 1.4 0.0 - 5.0 %   Basophils Relative 0.3 0.0 - 3.0 %   Neutro Abs 3.4 1.4 - 7.7 K/uL   Lymphs Abs 1.9 0.7 - 4.0 K/uL    Monocytes Absolute 0.4 0.1 - 1.0 K/uL   Eosinophils Absolute 0.1 0.0 - 0.7 K/uL   Basophils Absolute 0.0 0.0 - 0.1 K/uL  Basic metabolic panel  Result Value Ref Range   Sodium 141 135 - 145 mEq/L   Potassium 4.7 3.5 - 5.1 mEq/L   Chloride 103 96 - 112 mEq/L   CO2 29 19 - 32 mEq/L   Glucose, Bld 111 (H) 70 - 99 mg/dL   BUN 17 6 - 23 mg/dL   Creatinine, Ser 0.96 0.40 - 1.50 mg/dL   Calcium 9.8 8.4 - 10.5 mg/dL   GFR 90.44 >60.00 mL/min   Assessment & Plan:   Problem List Items Addressed This Visit    Sore throat    Anticipate allergic. Resolved over last 2 days - continue zyrtec PRN.       Hordeolum externum left upper eyelid - Primary    Story/exam most consistent with this. Supportive care reviewed with topical warm compresses, ibuprofen. Discussed indications to use erythromycin ointment - sent in in case needed. Update if worsening symptoms or not improving in 1-2 wks for eye doctor evaluation. Pt agrees with plan.       B12 deficiency    Encouraged ongoing b12 supplementation.           Meds ordered this encounter  Medications  . erythromycin ophthalmic ointment    Sig: Place 1 application into the left eye at bedtime.    Dispense:  3.5 g    Refill:  0   No orders of the defined types were placed in this encounter.   Follow up plan: No follow-ups on file.  Ria Bush, MD

## 2018-05-06 NOTE — Assessment & Plan Note (Signed)
Anticipate allergic. Resolved over last 2 days - continue zyrtec PRN.

## 2018-05-24 ENCOUNTER — Telehealth: Payer: Self-pay | Admitting: Family Medicine

## 2018-05-24 DIAGNOSIS — H00014 Hordeolum externum left upper eyelid: Secondary | ICD-10-CM

## 2018-05-24 NOTE — Telephone Encounter (Signed)
Do recommend he use erythromycin ointment sent in.  Also may try OTC lubricating eye drops.  I have also placed a referral to eye doctor.

## 2018-05-24 NOTE — Telephone Encounter (Signed)
Best number [phone]  Pt called stating he is eye is not any better and he has one on other side now. Swelling went down but the bump is still there his eyes feels scratchy today like he has something in them.    Pt didn't pick up medication sent in.  He wanted to know what he needs to do

## 2018-05-25 NOTE — Telephone Encounter (Signed)
Spoke with pt relaying Dr. G's message. Pt verbalizes understanding.  

## 2018-05-28 ENCOUNTER — Telehealth: Payer: Self-pay | Admitting: Family Medicine

## 2018-05-28 NOTE — Telephone Encounter (Signed)
Called patient several times yesterday to set up Opthy Appt but never got through to the patient. Faxed notes to Dr Rick Duff as they were willing to see the patient today. Got through to the patient and he says his eyes have cleared up the bump has gone away and he doesn't need to see the eye Dr. Lorin Mercy Referral.

## 2018-05-28 NOTE — Telephone Encounter (Signed)
Noted. Thanks.

## 2018-06-16 ENCOUNTER — Telehealth: Payer: Self-pay | Admitting: Family Medicine

## 2018-06-16 NOTE — Telephone Encounter (Signed)
Called patient back and he still has plenty of medicine. He restarted using it last night. This one does hurt a little but not as bad as the last one he had. He will continue using the medicine and warm compresses and if no better next week he will call back for a Referral to Opthy.

## 2018-06-16 NOTE — Telephone Encounter (Signed)
Does he still have erythromycin ointment? May use for other eye. Use warm compresses as well. If not resolving with this over the next week, we could refer back to ophtho.  May send in refill of erythromycin ointment if needed.

## 2018-06-16 NOTE — Telephone Encounter (Signed)
Patient called and now has a new stye in his right eye which started two days ago. His eye is blurry and it has heat to it he says. Please advise what to do? We cancelled the Opthy Referral that you placed previously because the patient said his eye cleared up.

## 2018-06-16 NOTE — Telephone Encounter (Signed)
Noted  

## 2018-06-17 ENCOUNTER — Encounter: Payer: Self-pay | Admitting: Family Medicine

## 2018-06-17 ENCOUNTER — Ambulatory Visit (INDEPENDENT_AMBULATORY_CARE_PROVIDER_SITE_OTHER): Payer: Managed Care, Other (non HMO) | Admitting: Family Medicine

## 2018-06-17 VITALS — Ht 69.0 in

## 2018-06-17 DIAGNOSIS — L03213 Periorbital cellulitis: Secondary | ICD-10-CM | POA: Insufficient documentation

## 2018-06-17 MED ORDER — AMOXICILLIN-POT CLAVULANATE 875-125 MG PO TABS: 1.0000 | ORAL_TABLET | Freq: Two times a day (BID) | ORAL | 0 refills | Status: AC

## 2018-06-17 MED ORDER — DOXYCYCLINE HYCLATE 100 MG PO TABS: 100.0000 mg | ORAL_TABLET | Freq: Two times a day (BID) | ORAL | 0 refills | Status: DC

## 2018-06-17 NOTE — Progress Notes (Signed)
Virtual visit completed through [employer]. Due to national recommendations of social distancing due to COVID-19, a virtual visit is felt to be most appropriate for this patient at this time.   Patient location: home Provider location: [place] at [facility], office If any vitals were documented, they were collected by patient at home unless specified below.    Ht 5\' 9"  (1.753 m)   BMI 34.70 kg/m    CC: R eye swollen, check stye Subjective:    Patient ID: Phillip Evans, male    DOB: [birth year], 48 y.o.   MRN: [id number]  HPI: Phillip Evans is a 48 y.o. male presenting on 06/17/2018 for Stye (C/o stye on right eye. Eye is swollen shut. Seen 2 wks ago, meds helped. Says issue has returned. )   H/o L eye stye last month that resolved with erythomycin ointment use.   Now with 2d h/o R upper eyelid swelling acutely worse this morning. No obvious bump this time. Took advil this morning due to lateral upper eye throbbing pain.  No redness of eye. No vision changes.  No pain with eye movement.  Some drainage of eye.  Treating with cool compresses with benefit.   No ST, congestion, allergy symptoms. No fevers/chills, nausea.      Relevant past medical, surgical, family and social history reviewed and updated as indicated. Interim medical history since our last visit reviewed. Allergies and medications reviewed and updated. Outpatient Medications Prior to Visit  Medication Sig Dispense Refill  . erythromycin ophthalmic ointment Place 1 application into the left eye at bedtime. 3.5 g 0  . omeprazole (PRILOSEC OTC) 20 MG tablet Take 20 mg by mouth daily.     . vitamin B-12 (CYANOCOBALAMIN) 1000 MCG tablet Take 1 tablet (1,000 mcg total) by mouth daily.     No facility-administered medications prior to visit.      Per HPI unless specifically indicated in ROS section below Review of Systems Objective:    Ht 5\' 9"  (1.753 m)   BMI 34.70 kg/m   Wt Readings from Last 3 Encounters:   05/06/18 235 lb (106.6 kg)  12/28/17 245 lb 4 oz (111.2 kg)  06/24/17 238 lb 12 oz (108.3 kg)     Physical exam: Gen: alert, NAD, not ill appearing Eye - marked swelling of R upper eyelid. EOMI without pain. No evident bulbar conjunctival erythema.  Pulm: speaks in complete sentences without increased work of breathing Psych: normal mood, normal thought content       Assessment & Plan:  Patient will call us tomorrow around noon after 24 hours of abx with update. If worsening, low threshold to refer to ophtho.  Problem List Items Addressed This Visit    Preseptal cellulitis of right upper eyelid - Primary    With redness and swelling noted, anticipate preseptal cellulitis of R eye. Treat with augmentin and doxycycline antibiotic course x7d (cover MRSA). Update tomorrow, low threshold to refer to eye doctor. EOMI without pain and no bulbar conjunctival erythema - not consistent with orbital cellulitis.           Meds ordered this encounter  Medications  . amoxicillin-clavulanate (AUGMENTIN) 875-125 MG tablet    Sig: Take 1 tablet by mouth 2 (two) times daily for 7 days.    Dispense:  14 tablet    Refill:  0  . doxycycline (VIBRA-TABS) 100 MG tablet    Sig: Take 1 tablet (100 mg total) by mouth 2 (two) times daily.    Dispense:  14 tablet    Refill:  0   No orders of the defined types were placed in this encounter.   Follow up plan: Return if symptoms worsen or fail to improve.  Ria Bush, MD

## 2018-06-17 NOTE — Assessment & Plan Note (Signed)
With redness and swelling noted, anticipate preseptal cellulitis of R eye. Treat with augmentin and doxycycline antibiotic course x7d (cover MRSA). Update tomorrow, low threshold to refer to eye doctor. EOMI without pain and no bulbar conjunctival erythema - not consistent with orbital cellulitis.

## 2018-06-18 ENCOUNTER — Telehealth: Payer: Self-pay | Admitting: Family Medicine

## 2018-06-18 NOTE — Telephone Encounter (Signed)
Pt called with update on eye. Today he feels like he has a fever. He has taken three rounds of antibiotics. His face and eyelid are warm. Can see today. Swelling has gone down a little. But it has swollen underneath eye. He can be reached at [phone].

## 2018-06-18 NOTE — Telephone Encounter (Signed)
Spoke with patient. Eye swelling is improving. Felt feverish, "flu like" malaise, that improved with advil.  Red flags to seek care over weekend reviewed. Pt agrees with plan.

## 2018-08-16 ENCOUNTER — Ambulatory Visit: Payer: Managed Care, Other (non HMO) | Admitting: Family Medicine

## 2018-08-16 ENCOUNTER — Encounter: Payer: Self-pay | Admitting: Family Medicine

## 2018-08-16 ENCOUNTER — Other Ambulatory Visit: Payer: Self-pay

## 2018-08-16 VITALS — BP 128/80 | HR 76 | Temp 98.3°F | Ht 69.0 in | Wt 248.2 lb

## 2018-08-16 DIAGNOSIS — H00024 Hordeolum internum left upper eyelid: Secondary | ICD-10-CM

## 2018-08-16 NOTE — Assessment & Plan Note (Signed)
Recurrent stye. Supportive care along with erythromycin ointment. Given recurrence as well as recent preseptal cellulitis on right, did recommend ophthalmology eval for full eye exam/recs. Pt agrees with plan.

## 2018-08-16 NOTE — Patient Instructions (Signed)
I think you do have recurrent stye on left - treat with continued erythromycin ointment and warm compresses.  I do want you to see eye doctor for recurrent issues with eyelids.   Stye  A stye, also known as a hordeolum, is a bump that forms on an eyelid. It may look like a pimple next to the eyelash. A stye can form inside the eyelid (internal stye) or outside the eyelid (external stye). A stye can cause redness, swelling, and pain on the eyelid. Styes are very common. Anyone can get them at any age. They usually occur in just one eye, but you may have more than one in either eye. What are the causes? A stye is caused by an infection. The infection is almost always caused by bacteria called Staphylococcus aureus. This is a common type of bacteria that lives on the skin. An internal stye may result from an infected oil-producing gland inside the eyelid. An external stye may be caused by an infection at the base of the eyelash (hair follicle). What increases the risk? You are more likely to develop a stye if:  You have had a stye before.  You have any of these conditions: ? Diabetes. ? Red, itchy, inflamed eyelids (blepharitis). ? A skin condition such as seborrheic dermatitis or rosacea. ? High fat levels in your blood (lipids). What are the signs or symptoms? The most common symptom of a stye is eyelid pain. Internal styes are more painful than external styes. Other symptoms may include:  Painful swelling of your eyelid.  A scratchy feeling in your eye.  Tearing and redness of your eye.  Pus draining from the stye. How is this diagnosed? Your health care provider may be able to diagnose a stye just by examining your eye. The health care provider may also check to make sure:  You do not have a fever or other signs of a more serious infection.  The infection has not spread to other parts of your eye or areas around your eye. How is this treated? Most styes will clear up in a few  days without treatment or with warm compresses applied to the area. You may need to use antibiotic drops or ointment to treat an infection. In some cases, if your stye does not heal with routine treatment, your health care provider may drain pus from the stye using a thin blade or needle. This may be done if the stye is large, causing a lot of pain, or affecting your vision. Follow these instructions at home:  Take over-the-counter and prescription medicines only as told by your health care provider. This includes eye drops or ointments.  If you were prescribed an antibiotic medicine, apply or use it as told by your health care provider. Do not stop using the antibiotic even if your condition improves.  Apply a warm, wet cloth (warm compress) to your eye for 5-10 minutes, 4 times a day.  Clean the affected eyelid as directed by your health care provider.  Do not wear contact lenses or eye makeup until your stye has healed.  Do not try to pop or drain the stye.  Do not rub your eye. Contact a health care provider if:  You have chills or a fever.  Your stye does not go away after several days.  Your stye affects your vision.  Your eyeball becomes swollen, red, or painful. Get help right away if:  You have pain when moving your eye around. Summary  A  stye is a bump that forms on an eyelid. It may look like a pimple next to the eyelash.  A stye can form inside the eyelid (internal stye) or outside the eyelid (external stye). A stye can cause redness, swelling, and pain on the eyelid.  Your health care provider may be able to diagnose a stye just by examining your eye.  Apply a warm, wet cloth (warm compress) to your eye for 5-10 minutes, 4 times a day. This information is not intended to replace advice given to you by your health care provider. Make sure you discuss any questions you have with your health care provider. Document Released: 10/23/2004 Document Revised: 12/26/2016  Document Reviewed: 09/25/2016 Elsevier Patient Education  2020 [employer].

## 2018-08-16 NOTE — Progress Notes (Signed)
This visit was conducted in person.  BP 128/80 (BP Location: Left Arm, Patient Position: Sitting, Cuff Size: Normal)   Pulse 76   Temp 98.3 F (36.8 C) (Temporal)   Ht 5\' 9"  (1.753 m)   Wt 248 lb 3.2 oz (112.6 kg)   SpO2 97%   BMI 36.65 kg/m    CC: L eye pain/swelling/redness Subjective:    Patient ID: Phillip Evans, male    DOB: April 27, 1970, 48 y.o.   MRN: [id number]  HPI: Phillip Evans is a 48 y.o. male presenting on 08/16/2018 for Belepharitis (Left eye, painful, red, swollen and itching x 1 day. pt reports recurrent issue, previously completed oral abx Doxy for the same infection in right eye. Pt currently using Erythromycin ointment in left eye. Having some noted yellow discharge. Eye is swollen 50% closed today. )   2 night h/o L eye pain, redness and swelling of upper eyelid, pruritis. He started using erythromycin ointment.  No blurry vision or other vision changes. No pain with eye movements.  No fevers/chills. No skin rash.   H/o L eye stye 04/2018 treated with erythromycin ointment.  H/o preseptal cellulitis R upper eyelid 05/2018 treated with augmentin/doxycycline course - with full resolution at that time as well.   When he sweats, notes red bumps to upper eyelid. Rubs eyes with sleeve.      Relevant past medical, surgical, family and social history reviewed and updated as indicated. Interim medical history since our last visit reviewed. Allergies and medications reviewed and updated. Outpatient Medications Prior to Visit  Medication Sig Dispense Refill  . erythromycin ophthalmic ointment Place 1 application into the left eye at bedtime. 3.5 g 0  . omeprazole (PRILOSEC OTC) 20 MG tablet Take 20 mg by mouth daily.     . vitamin B-12 (CYANOCOBALAMIN) 1000 MCG tablet Take 1 tablet (1,000 mcg total) by mouth daily.    Marland Kitchen doxycycline (VIBRA-TABS) 100 MG tablet Take 1 tablet (100 mg total) by mouth 2 (two) times daily. (Patient not taking: Reported on 08/16/2018) 14 tablet 0    No facility-administered medications prior to visit.      Per HPI unless specifically indicated in ROS section below Review of Systems Objective:    BP 128/80 (BP Location: Left Arm, Patient Position: Sitting, Cuff Size: Normal)   Pulse 76   Temp 98.3 F (36.8 C) (Temporal)   Ht 5\' 9"  (1.753 m)   Wt 248 lb 3.2 oz (112.6 kg)   SpO2 97%   BMI 36.65 kg/m   Wt Readings from Last 3 Encounters:  08/16/18 248 lb 3.2 oz (112.6 kg)  05/06/18 235 lb (106.6 kg)  12/28/17 245 lb 4 oz (111.2 kg)    Physical Exam Vitals signs and nursing note reviewed.  Constitutional:      Appearance: Normal appearance. He is not ill-appearing.  Eyes:     General:        Right eye: No discharge or hordeolum.        Left eye: Hordeolum (upper inner lateral eyelid) present.No discharge.     Extraocular Movements: Extraocular movements intact.     Right eye: Normal extraocular motion.     Left eye: Normal extraocular motion.     Conjunctiva/sclera:     Right eye: Right conjunctiva is not injected.     Left eye: Left conjunctiva is injected (slightly).     Pupils: Pupils are equal, round, and reactive to light.     Slit lamp exam:  Right eye: Anterior chamber quiet.     Left eye: Anterior chamber quiet.      Comments: L upper eyelid edema, lateral inner eyelid hordeolum  Cardiovascular:     Rate and Rhythm: Normal rate and regular rhythm.  Neurological:     Mental Status: He is alert.       Assessment & Plan:   Problem List Items Addressed This Visit    Hordeolum internum left upper eyelid - Primary    Recurrent stye. Supportive care along with erythromycin ointment. Given recurrence as well as recent preseptal cellulitis on right, did recommend ophthalmology eval for full eye exam/recs. Pt agrees with plan.       Relevant Orders   Ambulatory referral to Ophthalmology       No orders of the defined types were placed in this encounter.  Orders Placed This Encounter  Procedures  .  Ambulatory referral to Ophthalmology    Referral Priority:   Routine    Referral Type:   Consultation    Referral Reason:   Specialty Services Required    Requested Specialty:   Ophthalmology    Number of Visits Requested:   1    Follow up plan: No follow-ups on file.  Ria Bush, MD

## 2018-12-29 ENCOUNTER — Telehealth: Payer: Self-pay | Admitting: Family Medicine

## 2018-12-29 DIAGNOSIS — Z20828 Contact with and (suspected) exposure to other viral communicable diseases: Secondary | ICD-10-CM

## 2018-12-29 DIAGNOSIS — Z20822 Contact with and (suspected) exposure to covid-19: Secondary | ICD-10-CM

## 2018-12-29 DIAGNOSIS — R519 Headache, unspecified: Secondary | ICD-10-CM

## 2018-12-29 NOTE — Telephone Encounter (Signed)
Spoke with pt relaying Dr. Synthia Innocent message.  Pt was given the following Drive-Up testing site info:  [place], [place], M-F 10-3, wear mask and stay in vehicle.  Says he will go tomorrow.  Pt is asking for a work letter from Dr. Darnell Level about pt needing to be out of work be sent to him via Pharmac[employer], community.  Also, he asks if he needs to quarantine away from members of his household.

## 2018-12-29 NOTE — Telephone Encounter (Signed)
Do recommend he get tested given exposure and symptoms present.  I have ordered test. Recommend stay out of work for 10 days starting from Sunday and get tested tomorrow (as testing site is now closed).

## 2018-12-29 NOTE — Telephone Encounter (Signed)
Pt called stating a coworker that work in same office as pt was dx with covid today 12/2.  coworker worked Monday 1/2 day tested 12/1.  Pt was around coworker on Monday pt did not have mask on pt coworker did.  Pt stated on Sunday he was dizzy nausea but not now.  He has a headache now.   And wants to know if or when he should be tested.  Pt is still at work.  Pt is aware of the [facility] drive thru covid testing site

## 2018-12-30 ENCOUNTER — Other Ambulatory Visit: Payer: Self-pay

## 2018-12-30 ENCOUNTER — Encounter: Payer: Self-pay | Admitting: Family Medicine

## 2018-12-30 DIAGNOSIS — Z20822 Contact with and (suspected) exposure to covid-19: Secondary | ICD-10-CM

## 2018-12-30 NOTE — Telephone Encounter (Signed)
I spoke with pt;and pt said he is only person in the office; pt said he does not have any symptoms. 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 Pt said his work has told pt if no symptoms pt should come to work; pt is on his way now to [facility] for testing. Pt request letter sent to mychart if Dr Darnell Level has different recommendations than pts employer. Pt request cb.

## 2018-12-30 NOTE — Telephone Encounter (Signed)
[facility] - Client TELEPHONE ADVICE RECORD [facility] Patient Name: Phillip Evans Gender: Male DOB: [birth year] Age: 48 Y 11 M 24 D Return Phone Number: [phone] (Primary) Address: City/State/Zip: [place] [address] Client [facility] - Client Client Site [place] Physician Ria Bush - MD Contact Type Call Who Is Calling Patient / Member / Family / Caregiver Call Type Triage / Clinical Relationship To Patient Self Return Phone Number [phone] (Primary) Chief Complaint Health information question (non symptomatic) Reason for Call Symptomatic / Request for [place] has questions about quarantine. Patient was told to quarantine from work. He has no symptoms Translation No Nurse Assessment Guidelines Guideline Title Affirmed Question Affirmed Notes Nurse Date/Time (Eastern Time) Coronavirus ([id number]) - Exposure [1] Caller concerned that exposure to COVID-19 occurred BUT [2] does not meet COVID-19 EXPOSURE criteria from [facility], RN, Miranda 12/30/2018 8:41:03 AM Disp. Time Eilene Ghazi Time) Disposition Final User 12/30/2018 8:10:10 AM Attempt made - message left Ronnald Ramp RN, Miranda 12/30/2018 9:00:06 AM Home Care Yes Ronnald Ramp, RN, Miranda Caller Disagree/Comply Comply Caller Understands Yes PreDisposition Call Doctor Care Advice Given Per Guideline HOME CARE: * You should be able to treat this at home. NOTE TO TRIAGER - CALLER REMAINS WORRIED AFTER EDUCATION AND REASSURANCE: * Encourage the caller to phone their healthcare provider (e.g., doctor, NP, PA) or public health department. * Discourage the caller from going to a healthcare facility unless warranted based on caller's symptoms. COVID-19 - SYMPTOMS: * COVID-19 most often causes a respiratory illness. * The most common symptoms are: cough, fever, and shortness of breath. * Some people  may have minimal symptoms or even have no symptoms (asymptomatic). COVID-19 - EXPOSURE RISK FACTORS: * Here are the main risk factors for getting sick with COVID-19. * CLOSE CONTACT WITH A PERSON who tested positive for COVID-19 AND contact occurred while they were ill. Close contact means being within 6 feet (2 meters) for a total of 15 minutes or more in a 24-hour period. This includes living with someone infected with COVID-19. CALL BACK IF: * You have more questions. CARE ADVICE given per Coronavirus (COVID-19) - Exposure (Adult) guideline.

## 2018-12-30 NOTE — Telephone Encounter (Signed)
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 He would like to know If him needing to be quarantine is recommended or is this required.  Patient is going back to work today and wanted to make sure this quarentine for him is mandated or not.    Patient is requesting a call back

## 2018-12-30 NOTE — Telephone Encounter (Signed)
Spoke with patient.  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  Work asked him to continue working as asymptomatic.  Can limit contact with public- has barriers in place, wears mask.  Had test done this morning.  Reviewed CDC recommendations with patient.  Will await test, he will let us know if any symptoms develop.

## 2019-01-02 LAB — NOVEL CORONAVIRUS, NAA: SARS-CoV-2, NAA: NOT DETECTED

## 2019-07-04 ENCOUNTER — Encounter: Payer: Self-pay | Admitting: Emergency Medicine

## 2019-07-04 ENCOUNTER — Telehealth: Payer: Self-pay

## 2019-07-04 ENCOUNTER — Other Ambulatory Visit: Payer: Self-pay

## 2019-07-04 ENCOUNTER — Emergency Department
Admission: EM | Admit: 2019-07-04 | Discharge: 2019-07-04 | Disposition: A | Discharge status: Home / Self Care | Payer: Managed Care, Other (non HMO) | Attending: Emergency Medicine | Admitting: Emergency Medicine

## 2019-07-04 DIAGNOSIS — K2100 Gastro-esophageal reflux disease with esophagitis, without bleeding: Secondary | ICD-10-CM | POA: Diagnosis not present

## 2019-07-04 DIAGNOSIS — R109 Unspecified abdominal pain: Secondary | ICD-10-CM | POA: Insufficient documentation

## 2019-07-04 DIAGNOSIS — R0789 Other chest pain: Secondary | ICD-10-CM | POA: Diagnosis not present

## 2019-07-04 DIAGNOSIS — R12 Heartburn: Secondary | ICD-10-CM | POA: Diagnosis present

## 2019-07-04 LAB — URINALYSIS, COMPLETE (UACMP) WITH MICROSCOPIC
Bacteria, UA: NONE SEEN
Bilirubin Urine: NEGATIVE
Glucose, UA: NEGATIVE mg/dL
Hgb urine dipstick: NEGATIVE
Ketones, ur: NEGATIVE mg/dL
Leukocytes,Ua: NEGATIVE
Nitrite: NEGATIVE
Protein, ur: NEGATIVE mg/dL
Specific Gravity, Urine: 1.011 (ref 1.005–1.030)
Squamous Epithelial / HPF: NONE SEEN (ref 0–5)
pH: 6 (ref 5.0–8.0)

## 2019-07-04 LAB — CBC
HCT: 42.1 % (ref 39.0–52.0)
Hemoglobin: 14.5 g/dL (ref 13.0–17.0)
MCH: 27.6 pg (ref 26.0–34.0)
MCHC: 34.4 g/dL (ref 30.0–36.0)
MCV: 80.2 fL (ref 80.0–100.0)
Platelets: 234 10*3/uL (ref 150–400)
RBC: 5.25 MIL/uL (ref 4.22–5.81)
RDW: 13 % (ref 11.5–15.5)
WBC: 6.3 10*3/uL (ref 4.0–10.5)
nRBC: 0 % (ref 0.0–0.2)

## 2019-07-04 LAB — COMPREHENSIVE METABOLIC PANEL
ALT: 27 U/L (ref 0–44)
AST: 21 U/L (ref 15–41)
Albumin: 4.2 g/dL (ref 3.5–5.0)
Alkaline Phosphatase: 74 U/L (ref 38–126)
Anion gap: 10 (ref 5–15)
BUN: 17 mg/dL (ref 6–20)
CO2: 25 mmol/L (ref 22–32)
Calcium: 9.4 mg/dL (ref 8.9–10.3)
Chloride: 103 mmol/L (ref 98–111)
Creatinine, Ser: 1.15 mg/dL (ref 0.61–1.24)
GFR calc Af Amer: >60 mL/min (ref >60)
GFR calc non Af Amer: >60 mL/min (ref >60)
Glucose, Bld: 117 mg/dL — ABNORMAL HIGH (ref 70–99)
Potassium: 3.9 mmol/L (ref 3.5–5.1)
Sodium: 138 mmol/L (ref 135–145)
Total Bilirubin: 0.7 mg/dL (ref 0.3–1.2)
Total Protein: 7.6 g/dL (ref 6.5–8.1)

## 2019-07-04 LAB — TROPONIN I (HIGH SENSITIVITY): Troponin I (High Sensitivity): 2 ng/L (ref <18)

## 2019-07-04 LAB — LIPASE, BLOOD: Lipase: 29 U/L (ref 11–51)

## 2019-07-04 MED ORDER — LIDOCAINE VISCOUS HCL 2 % MT SOLN
15.0000 mL | Freq: Once | OROMUCOSAL | Status: AC
  Administered 2019-07-04: 15 mL via ORAL
  Filled 2019-07-04: qty 15

## 2019-07-04 MED ORDER — SODIUM CHLORIDE 0.9% FLUSH: 3.0000 mL | Freq: Once | INTRAVENOUS | Status: DC

## 2019-07-04 MED ORDER — ALUM & MAG HYDROXIDE-SIMETH 200-200-20 MG/5ML PO SUSP
15.0000 mL | Freq: Once | ORAL | Status: AC
  Administered 2019-07-04: 15 mL via ORAL
  Filled 2019-07-04: qty 30

## 2019-07-04 MED ORDER — LIDOCAINE VISCOUS HCL 2 % MT SOLN
15.0000 mL | OROMUCOSAL | 1 refills | Status: DC | PRN
Start: 2019-07-04 — End: 2019-07-05

## 2019-07-04 NOTE — Telephone Encounter (Signed)
Reassuring eval. Improved with GI cocktail. F/u appt scheduled tomorrow.

## 2019-07-04 NOTE — ED Notes (Signed)
Pt alert and oriented X 4, stable for discharge. RR even and unlabored, color WNL. Discussed discharge instructions and follow up when appropriate. Instructed to follow up with ER for any life threatening symptoms or concerns that patient or family of patient may have  

## 2019-07-04 NOTE — ED Provider Notes (Signed)
[facility] Emergency Department Provider Note   ____________________________________________   First MD Initiated Contact with Patient 07/04/19 1213     (approximate)  I have reviewed the triage vital signs and the nursing notes.   HISTORY  Chief Complaint Heartburn    HPI Phillip Evans is a 49 y.o. male with past medical history of GERD who presents to the ED complaining of chest and abdominal pain.  Patient reports that he has dealt with heartburn and GERD for a long time, takes Prilosec on a daily basis.  He recently returned from a trip to [place], where he drank more alcohol than usual and had lots of spicy food, since then has had increased burning pain in his epigastrium and lower chest.  He describes this as similar to prior episodes of GERD but has not been controlled by his usual Prilosec and Pepcid as needed.  He has cut back on his alcohol intake, but does state he has been taking Bayer aspirin 2 help with the symptoms.  He has not had any nausea, vomiting, shortness of breath, fevers, cough, or blood in his stool.  He spoke with his PCPs office, who advised him to come to the ED for evaluation.        Past Medical History:  Diagnosis Date  . GERD (gastroesophageal reflux disease)   . Lactose intolerance   . Obesity     Patient Active Problem List   Diagnosis Date Noted  . Preseptal cellulitis of right upper eyelid 06/17/2018  . Hordeolum internum left upper eyelid 05/06/2018  . Sore throat 05/06/2018  . Right tennis elbow 12/28/2017  . Neck pain on left side 12/29/2016  . Pre-syncope 12/29/2016  . B12 deficiency 09/27/2015  . Right leg injury 05/19/2012  . Foot swelling 05/13/2012  . Obesity, Class II, BMI 35-39.9, no comorbidity ([place]) 06/11/2010  . Fatigue 06/11/2010    Past Surgical History:  Procedure Laterality Date  . ESOPHAGOGASTRODUODENOSCOPY  2000   WNL per pt  . R hand surgery  1994   with tendon reconstruction     Prior to Admission medications   Medication Sig Start Date End Date Taking? Authorizing Provider  erythromycin ophthalmic ointment Place 1 application into the left eye at bedtime. 05/06/18   Ria Bush, MD  lidocaine (XYLOCAINE) 2 % solution Use as directed 15 mLs in the mouth or throat as needed for mouth pain. 07/04/19   Blake Divine, MD  omeprazole (PRILOSEC OTC) 20 MG tablet Take 20 mg by mouth daily.     [provider]  vitamin B-12 (CYANOCOBALAMIN) 1000 MCG tablet Take 1 tablet (1,000 mcg total) by mouth daily. 12/31/16   Ria Bush, MD    Allergies Oxycodone, Sulfur, and Tetanus toxoids  Family History  Problem Relation Age of Onset  . Diabetes Father   . Cancer Maternal Grandmother        stomach cancer  . Coronary artery disease Maternal Grandfather 77       CAD/MI  . Cancer Maternal Grandfather        unsure which  . Hypertension Maternal Grandfather   . ALS Maternal Grandfather   . Hypertension Paternal Grandfather     Social History Social History   Tobacco Use  . Smoking status: Never Smoker  . Smokeless tobacco: Never Used  Substance Use Topics  . Alcohol use: No    Alcohol/week: 0.0 standard drinks  . Drug use: No    Review of Systems  Constitutional: No  fever/chills Eyes: No visual changes. ENT: No sore throat. Cardiovascular: Positive for chest pain. Respiratory: Denies shortness of breath. Gastrointestinal: Positive for abdominal pain.  No nausea, no vomiting.  No diarrhea.  No constipation. Genitourinary: Negative for dysuria. Musculoskeletal: Negative for back pain. Skin: Negative for rash. Neurological: Negative for headaches, focal weakness or numbness.  ____________________________________________   PHYSICAL EXAM:  VITAL SIGNS: ED Triage Vitals [07/04/19 0936]  Enc Vitals Group     BP (!) 142/79     Pulse Rate 66     Resp 16     Temp 97.8 F (36.6 C)     Temp Source Oral     SpO2 99 %     Weight 248  lb 3.8 oz (112.6 kg)     Height 5\' 9"  (1.753 m)     Head Circumference      Peak Flow      Pain Score 0     Pain Loc      Pain Edu?      Excl. in [place]?     Constitutional: Alert and oriented. Eyes: Conjunctivae are normal. Head: Atraumatic. Nose: No congestion/rhinnorhea. Mouth/Throat: Mucous membranes are moist. Neck: Normal ROM Cardiovascular: Normal rate, regular rhythm.  Systolic murmur noted. Respiratory: Normal respiratory effort.  No retractions. Lungs CTAB. Gastrointestinal: Soft and nontender. No distention. Genitourinary: deferred Musculoskeletal: No lower extremity tenderness nor edema. Neurologic:  Normal speech and language. No gross focal neurologic deficits are appreciated. Skin:  Skin is warm, dry and intact. No rash noted. Psychiatric: Mood and affect are normal. Speech and behavior are normal.  ____________________________________________   LABS (all labs ordered are listed, but only abnormal results are displayed)  Labs Reviewed  COMPREHENSIVE METABOLIC PANEL - Abnormal; Notable for the following components:      Result Value   Glucose, Bld 117 (*)    All other components within normal limits  URINALYSIS, COMPLETE (UACMP) WITH MICROSCOPIC - Abnormal; Notable for the following components:   Color, Urine STRAW (*)    APPearance CLEAR (*)    All other components within normal limits  LIPASE, BLOOD  CBC  TROPONIN I (HIGH SENSITIVITY)   ____________________________________________  EKG  ED ECG REPORT I, Blake Divine, the attending physician, personally viewed and interpreted this ECG.   Date: 07/04/2019  EKG Time: 9:31  Rate: 69  Rhythm: normal EKG, normal sinus rhythm, unchanged from previous tracings  Axis: Normal  Intervals:none  ST&T Change: None   PROCEDURES  Procedure(s) performed (including Critical Care):  Procedures   ____________________________________________   INITIAL IMPRESSION / ASSESSMENT AND PLAN / ED COURSE        49 year old male with possible history of GERD presents to the ED with increasing burning discomfort in his epigastrium and lower chest since returning from vacation about 1 month ago, has especially increased over the past 3 days.  He has no tenderness on his abdominal exam and lab work is thus far reassuring, LFTs and lipase within normal limits.  Symptoms sound most consistent with GERD, low suspicion for ACS given atypical symptoms with reassuring EKG.  We will screen single troponin given constant symptoms for 3 days, also trial GI cocktail.  He was advised to continue to limit alcohol intake as well as NSAID use.  Troponin within normal limits and patient reports pain has resolved following GI cocktail.  He is appropriate for discharge home and will be provided with GI follow-up, also has appointment scheduled with his PCP for tomorrow.  He was  counseled to continue Prilosec as well as Pepcid as needed, will add viscous lidocaine for use as needed on top of these.  Patient counseled to return to the ED for new or worsening symptoms, patient agrees with plan.     ____________________________________________   FINAL CLINICAL IMPRESSION(S) / ED DIAGNOSES  Final diagnoses:  Gastroesophageal reflux disease with esophagitis without hemorrhage     ED Discharge Orders         Ordered    lidocaine (XYLOCAINE) 2 % solution  As needed     07/04/19 1317           Note:  This document was prepared using Dragon voice recognition software and may include unintentional dictation errors.   Blake Divine, MD 07/04/19 1318

## 2019-07-04 NOTE — ED Triage Notes (Signed)
C/O worsening heartburn x 3 days.  Has history of GERD and takes Prilosec daily.  Has had to take Prilosec and pepcid with no improvement.   AAOx3.  Skin warm and dry. NAD

## 2019-07-04 NOTE — ED Notes (Signed)
Lab called to add on troponin 

## 2019-07-04 NOTE — Telephone Encounter (Signed)
[facility] - Client TELEPHONE ADVICE RECORD [facility] Patient Name: Phillip Evans Gender: Male DOB: 06/26/1970 Age: 49 Y 69 M 29 D Return Phone Number: [phone] (Primary) Address: City/State/Zip: [place] [address] Client [facility] - Client Client Site [facility] - Day Physician Ria Bush - MD Contact Type Call Who Is Calling Patient / Member / Family / Caregiver Call Type Triage / Clinical Relationship To Patient Self Return Phone Number [phone] (Primary) Chief Complaint CHEST PAIN (>=21 years) - pain, pressure, heaviness or tightness Reason for Call Symptomatic / Request for Health Information Initial Comment PT has had heartburn for over a week, pain in left arm and a sharp pain real quick in his chest from time to time. PT has been taking Prilosec. Has been out of country recently in [place] and ate spicy food there. [facility] ER. Translation No Nurse Assessment Nurse: Sherrell Puller, RN, Amy Date/Time Eilene Ghazi Time): 07/04/2019 8:32:43 AM Confirm and document reason for call. If symptomatic, describe symptoms. ---Caller states he's had heartburn for a week, sharp pain in chest that lasts for a few seconds, left arm pain. Was recently in [place] and ate very spicy food. Has the patient had close contact with a person known or suspected to have the novel coronavirus illness OR traveled / lives in area with major community spread (including international travel) in the last 14 days from the onset of symptoms? * If Asymptomatic, screen for exposure and travel within the last 14 days. ---No Does the patient have any new or worsening symptoms? ---Yes Will a triage be completed? ---Yes Related visit to physician within the last 2 weeks? ---No Does the PT have any chronic conditions? (i.e. diabetes, asthma, this includes High risk factors for  pregnancy, etc.) ---Yes List chronic conditions. ---GERD Is this a behavioral health or substance abuse call? ---No Guidelines Guideline Title Affirmed Question Affirmed Notes Nurse Date/Time (Eastern Time) Chest Pain Pain also in shoulder(s) or arm(s) or jaw (Exception: Ramsey, RN, Amy 07/04/2019 8:33:50 AMPLEASE NOTE: All timestamps contained within this report are represented as [place] Standard Time. CONFIDENTIALTY NOTICE: This fax transmission is intended only for the addressee. It contains information that is legally privileged, confidential or otherwise protected from use or disclosure. If you are not the intended recipient, you are strictly prohibited from reviewing, disclosing, copying using or disseminating any of this information or taking any action in reliance on or regarding this information. If you have received this fax in error, please notify us immediately by telephone so that we can arrange for its return to [place]. Phone: [phone], Toll-Free: [phone], Fax: [phone] Page: 2 of 2 Call Id: [id number] Guidelines Guideline Title Affirmed Question Affirmed Notes Nurse Date/Time Eilene Ghazi Time) pain is clearly made worse by movement) Disp. Time Eilene Ghazi Time) Disposition Final User 07/04/2019 8:31:31 AM Send to Urgent Queue Silvano Rusk 07/04/2019 8:41:14 AM Go to ED Now Yes Sherrell Puller, RN, Amy Caller Disagree/Comply Comply Caller Understands Yes PreDisposition Did not know what to do Care Advice Given Per Guideline GO TO ED NOW: * You need to be seen in the Emergency Department. * Go to the ED at ___________ [place] now. Drive carefully. CARE ADVICE given per Chest Pain (Adult) guideline. CALL EMS IF: * Severe difficulty breathing occurs * Passes out or becomes too weak to stand * You become worse. Referrals GO TO FACILITY OTHER - SPECIFY

## 2019-07-05 ENCOUNTER — Encounter: Payer: Self-pay | Admitting: Family Medicine

## 2019-07-05 ENCOUNTER — Ambulatory Visit: Payer: Managed Care, Other (non HMO) | Admitting: Family Medicine

## 2019-07-05 DIAGNOSIS — K219 Gastro-esophageal reflux disease without esophagitis: Secondary | ICD-10-CM

## 2019-07-05 MED ORDER — OMEPRAZOLE 40 MG PO CPDR: 40.0000 mg | DELAYED_RELEASE_CAPSULE | Freq: Every day | ORAL | 3 refills | Status: DC

## 2019-07-05 NOTE — Progress Notes (Signed)
This visit was conducted in person.  BP 118/78 (BP Location: Left Arm, Patient Position: Sitting, Cuff Size: Large)   Pulse 60   Temp 98.1 F (36.7 C) (Temporal)   Ht 5\' 9"  (1.753 m)   Wt 246 lb 3 oz (111.7 kg)   SpO2 98%   BMI 36.36 kg/m    CC: worsening GERD Subjective:    Patient ID: Phillip Evans, male    DOB: 11/25/1970, 49 y.o.   MRN: [id number]  HPI: Phillip Evans is a 49 y.o. male presenting on 07/05/2019 for Gastroesophageal Reflux (C/o acid reflux and burning in chest is worsening.  Prilosec not working anymore.  Seen at [facility] ED.  Told the Bayer aspirin is upsetting lining in abd. )   Recent ER visit to [facility], note reviewed - acute worsening heartburn/GERD after after trip to [place], [place] for anniversary. Did have diarrheal illness the last day after he ate high spice verde sauce. Describes burning pain of epigastric region and lower chest - felt similar to prior GERD flares. However this time pepcid and prilosec 20mg  daily were ineffective. Also needed tums on Sunday. Advised to stop Bayer back and body he was taking (contains aspirin) - was taking several times a week for body aches. Maalox helps as well. Longstanding prilosec 20mg  use for the past 20 yrs. He does eat high acidic foods.   No cough, nausea/vomiting, dysphagia, fever/chills, dyspnea. No blood in stool, diarrhea/constipation. No unexpected weight changes, early satiety. No h/o peptic ulcer disease. EKG was reassuring. TnI was normal.  Symptoms improved with GI cocktail.   H/o remote EGD WNL per patient.   Advil worsens GERD 1-2 sodas/day with some tea. No other caffeine  No significant alcohol.      Relevant past medical, surgical, family and social history reviewed and updated as indicated. Interim medical history since our last visit reviewed. Allergies and medications reviewed and updated. Outpatient Medications Prior to Visit  Medication Sig Dispense Refill  . erythromycin ophthalmic ointment Place  1 application into the left eye at bedtime. 3.5 g 0  . omeprazole (PRILOSEC OTC) 20 MG tablet Take 20 mg by mouth daily.     . vitamin B-12 (CYANOCOBALAMIN) 1000 MCG tablet Take 1 tablet (1,000 mcg total) by mouth daily.    Marland Kitchen lidocaine (XYLOCAINE) 2 % solution Use as directed 15 mLs in the mouth or throat as needed for mouth pain. 100 mL 1   No facility-administered medications prior to visit.     Per HPI unless specifically indicated in ROS section below Review of Systems Objective:  BP 118/78 (BP Location: Left Arm, Patient Position: Sitting, Cuff Size: Large)   Pulse 60   Temp 98.1 F (36.7 C) (Temporal)   Ht 5\' 9"  (1.753 m)   Wt 246 lb 3 oz (111.7 kg)   SpO2 98%   BMI 36.36 kg/m   Wt Readings from Last 3 Encounters:  07/05/19 246 lb 3 oz (111.7 kg)  07/04/19 248 lb 3.8 oz (112.6 kg)  08/16/18 248 lb 3.2 oz (112.6 kg)      Physical Exam Vitals and nursing note reviewed.  Constitutional:      Appearance: Normal appearance. He is not ill-appearing.  Cardiovascular:     Rate and Rhythm: Normal rate and regular rhythm.     Pulses: Normal pulses.     Heart sounds: Normal heart sounds. No murmur.  Pulmonary:     Effort: Pulmonary effort is normal. No respiratory distress.  Breath sounds: Normal breath sounds. No wheezing, rhonchi or rales.  Abdominal:     General: Abdomen is flat. Bowel sounds are normal. There is no distension.     Palpations: Abdomen is soft. There is no mass.     Tenderness: There is abdominal tenderness (mild) in the epigastric area. There is no right CVA tenderness, left CVA tenderness, guarding or rebound.     Hernia: No hernia is present.  Musculoskeletal:     Right lower leg: No edema.     Left lower leg: No edema.  Skin:    Findings: No rash.  Neurological:     Mental Status: He is alert.  Psychiatric:        Mood and Affect: Mood normal.        Behavior: Behavior normal.       Results for orders placed or performed during the hospital  encounter of 07/04/19  Lipase, blood  Result Value Ref Range   Lipase 29 11 - 51 U/L  Comprehensive metabolic panel  Result Value Ref Range   Sodium 138 135 - 145 mmol/L   Potassium 3.9 3.5 - 5.1 mmol/L   Chloride 103 98 - 111 mmol/L   CO2 25 22 - 32 mmol/L   Glucose, Bld 117 (H) 70 - 99 mg/dL   BUN 17 6 - 20 mg/dL   Creatinine, Ser 1.15 0.61 - 1.24 mg/dL   Calcium 9.4 8.9 - 10.3 mg/dL   Total Protein 7.6 6.5 - 8.1 g/dL   Albumin 4.2 3.5 - 5.0 g/dL   AST 21 15 - 41 U/L   ALT 27 0 - 44 U/L   Alkaline Phosphatase 74 38 - 126 U/L   Total Bilirubin 0.7 0.3 - 1.2 mg/dL   GFR calc non Af Amer >60 >60 mL/min   GFR calc Af Amer >60 >60 mL/min   Anion gap 10 5 - 15  CBC  Result Value Ref Range   WBC 6.3 4.0 - 10.5 K/uL   RBC 5.25 4.22 - 5.81 MIL/uL   Hemoglobin 14.5 13.0 - 17.0 g/dL   HCT 42.1 39.0 - 52.0 %   MCV 80.2 80.0 - 100.0 fL   MCH 27.6 26.0 - 34.0 pg   MCHC 34.4 30.0 - 36.0 g/dL   RDW 13.0 11.5 - 15.5 %   Platelets 234 150 - 400 K/uL   nRBC 0.0 0.0 - 0.2 %  Urinalysis, Complete w Microscopic  Result Value Ref Range   Color, Urine STRAW (A) YELLOW   APPearance CLEAR (A) CLEAR   Specific Gravity, Urine 1.011 1.005 - 1.030   pH 6.0 5.0 - 8.0   Glucose, UA NEGATIVE NEGATIVE mg/dL   Hgb urine dipstick NEGATIVE NEGATIVE   Bilirubin Urine NEGATIVE NEGATIVE   Ketones, ur NEGATIVE NEGATIVE mg/dL   Protein, ur NEGATIVE NEGATIVE mg/dL   Nitrite NEGATIVE NEGATIVE   Leukocytes,Ua NEGATIVE NEGATIVE   RBC / HPF 0-5 0 - 5 RBC/hpf   WBC, UA 0-5 0 - 5 WBC/hpf   Bacteria, UA NONE SEEN NONE SEEN   Squamous Epithelial / LPF NONE SEEN 0 - 5  Troponin I (High Sensitivity)  Result Value Ref Range   Troponin I (High Sensitivity) 2 <18 ng/L   Assessment & Plan:  This visit occurred during the SARS-CoV-2 public health emergency.  Safety protocols were in place, including screening questions prior to the visit, additional usage of staff PPE, and extensive cleaning of exam room while  observing appropriate contact time as  indicated for disinfecting solutions.   Problem List Items Addressed This Visit    GERD (gastroesophageal reflux disease)    No red flags. Describes typical GERD symptoms - not improved with prior regimen of OTC prilosec + pepcid. Reviewed reassuring ER evaluation with patient.  Reviewed importance of avoiding causative medications/foods (spicy foods, alcohol, NSAIDs, aspirin, etc). Discussed lifestyle treatment including weight loss. Will Rx omeprazole 40mg  daily x 3 wks + pepcid PRN, then PRN.  I did ask him to hold PPI for 2 wks once feeling better so we can check H pylori. Pt agrees with plan.       Relevant Medications   omeprazole (PRILOSEC) 40 MG capsule       Meds ordered this encounter  Medications  . omeprazole (PRILOSEC) 40 MG capsule    Sig: Take 1 capsule (40 mg total) by mouth daily. For 3-4 wks then as needed    Dispense:  30 capsule    Refill:  3   No orders of the defined types were placed in this encounter.  Patient instructions: Let's start prescription strength omeprazole 40mg  daily for 3-4 weeks 30 min before largest meal with pepcid as needed.  Then may either use as needed or drop back down to 20mg  OTC dose.  Once symptoms are improved I'd like to test you for H pylori (need to be off acid reducers for 2 weeks prior to stool test).  Let us know sooner if symptoms not improving with treatment.   Follow up plan: No follow-ups on file.  Ria Bush, MD

## 2019-07-05 NOTE — Assessment & Plan Note (Addendum)
No red flags. Describes typical GERD symptoms - not improved with prior regimen of OTC prilosec + pepcid. Reviewed reassuring ER evaluation with patient.  Reviewed importance of avoiding causative medications/foods (spicy foods, alcohol, NSAIDs, aspirin, etc). Discussed lifestyle treatment including weight loss. Will Rx omeprazole 40mg  daily x 3 wks + pepcid PRN, then PRN.  I did ask him to hold PPI for 2 wks once feeling better so we can check H pylori. Pt agrees with plan.

## 2019-07-05 NOTE — Patient Instructions (Signed)
Let's start prescription strength omeprazole 40mg  daily for 3-4 weeks 30 min before largest meal with pepcid as needed.  Then may either use as needed or drop back down to 20mg  OTC dose.  Once symptoms are improved I'd like to test you for H pylori (need to be off acid reducers for 2 weeks prior to stool test).  Let us know sooner if symptoms not improving with treatment.   Gastroesophageal Reflux Disease, Adult Gastroesophageal reflux (GER) happens when acid from the stomach flows up into the tube that connects the mouth and the stomach (esophagus). Normally, food travels down the esophagus and stays in the stomach to be digested. With GER, food and stomach acid sometimes move back up into the esophagus. You may have a disease called gastroesophageal reflux disease (GERD) if the reflux:  Happens often.  Causes frequent or very bad symptoms.  Causes problems such as damage to the esophagus. When this happens, the esophagus becomes sore and swollen (inflamed). Over time, GERD can make small holes (ulcers) in the lining of the esophagus. What are the causes? This condition is caused by a problem with the muscle between the esophagus and the stomach. When this muscle is weak or not normal, it does not close properly to keep food and acid from coming back up from the stomach. The muscle can be weak because of:  Tobacco use.  Pregnancy.  Having a certain type of hernia (hiatal hernia).  Alcohol use.  Certain foods and drinks, such as coffee, chocolate, onions, and peppermint. What increases the risk? You are more likely to develop this condition if you:  Are overweight.  Have a disease that affects your connective tissue.  Use NSAID medicines. What are the signs or symptoms? Symptoms of this condition include:  Heartburn.  Difficult or painful swallowing.  The feeling of having a lump in the throat.  A bitter taste in the mouth.  Bad breath.  Having a lot of  saliva.  Having an upset or bloated stomach.  Belching.  Chest pain. Different conditions can cause chest pain. Make sure you see your doctor if you have chest pain.  Shortness of breath or noisy breathing (wheezing).  Ongoing (chronic) cough or a cough at night.  Wearing away of the surface of teeth (tooth enamel).  Weight loss. How is this treated? Treatment will depend on how bad your symptoms are. Your doctor may suggest:  Changes to your diet.  Medicine.  Surgery. Follow these instructions at home: Eating and drinking   Follow a diet as told by your doctor. You may need to avoid foods and drinks such as: ? Coffee and tea (with or without caffeine). ? Drinks that contain alcohol. ? Energy drinks and sports drinks. ? Bubbly (carbonated) drinks or sodas. ? Chocolate and cocoa. ? Peppermint and mint flavorings. ? Garlic and onions. ? Horseradish. ? Spicy and acidic foods. These include peppers, chili powder, curry powder, vinegar, hot sauces, and BBQ sauce. ? Citrus fruit juices and citrus fruits, such as oranges, lemons, and limes. ? Tomato-based foods. These include red sauce, chili, salsa, and pizza with red sauce. ? Fried and fatty foods. These include donuts, french fries, potato chips, and high-fat dressings. ? High-fat meats. These include hot dogs, rib eye steak, sausage, ham, and bacon. ? High-fat dairy items, such as whole milk, butter, and cream cheese.  Eat small meals often. Avoid eating large meals.  Avoid drinking large amounts of liquid with your meals.  Avoid eating meals  during the 2-3 hours before bedtime.  Avoid lying down right after you eat.  Do not exercise right after you eat. Lifestyle   Do not use any products that contain nicotine or tobacco. These include cigarettes, e-cigarettes, and chewing tobacco. If you need help quitting, ask your doctor.  Try to lower your stress. If you need help doing this, ask your doctor.  If you are  overweight, lose an amount of weight that is healthy for you. Ask your doctor about a safe weight loss goal. General instructions  Pay attention to any changes in your symptoms.  Take over-the-counter and prescription medicines only as told by your doctor. Do not take aspirin, ibuprofen, or other NSAIDs unless your doctor says it is okay.  Wear loose clothes. Do not wear anything tight around your waist.  Raise (elevate) the head of your bed about 6 inches (15 cm).  Avoid bending over if this makes your symptoms worse.  Keep all follow-up visits as told by your doctor. This is important. Contact a doctor if:  You have new symptoms.  You lose weight and you do not know why.  You have trouble swallowing or it hurts to swallow.  You have wheezing or a cough that keeps happening.  Your symptoms do not get better with treatment.  You have a hoarse voice. Get help right away if:  You have pain in your arms, neck, jaw, teeth, or back.  You feel sweaty, dizzy, or light-headed.  You have chest pain or shortness of breath.  You throw up (vomit) and your throw-up looks like blood or coffee grounds.  You pass out (faint).  Your poop (stool) is bloody or black.  You cannot swallow, drink, or eat. Summary  If a person has gastroesophageal reflux disease (GERD), food and stomach acid move back up into the esophagus and cause symptoms or problems such as damage to the esophagus.  Treatment will depend on how bad your symptoms are.  Follow a diet as told by your doctor.  Take all medicines only as told by your doctor. This information is not intended to replace advice given to you by your health care provider. Make sure you discuss any questions you have with your health care provider. Document Revised: 07/22/2017 Document Reviewed: 07/22/2017 Elsevier Patient Education  [employer].

## 2019-09-02 ENCOUNTER — Telehealth: Payer: Self-pay | Admitting: Family Medicine

## 2019-09-05 NOTE — Telephone Encounter (Signed)
Agree (I think you routed to me)

## 2019-09-05 NOTE — Telephone Encounter (Signed)
E-scribed refill.  Plz schedule cpe and lab visits.  

## 2019-09-28 ENCOUNTER — Other Ambulatory Visit: Payer: Self-pay | Admitting: Family Medicine
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
## 2019-10-19 ENCOUNTER — Telehealth: Payer: Self-pay | Admitting: Family Medicine

## 2019-10-19 NOTE — Telephone Encounter (Signed)
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 Chills/ Off and on headache.  Spoke with Rollene Fare L she recommend pt go to urgent care to be tested  Pt will go to urgent care today

## 2019-10-19 NOTE — Telephone Encounter (Signed)
Plz call for update on symptoms and UCC results.  Anticipate COVID if son is ill with this.  If tested positive, would qualify for mAb infusion - would offer this to him.

## 2019-10-21 ENCOUNTER — Encounter: Payer: Self-pay | Admitting: Family Medicine

## 2019-10-21 ENCOUNTER — Other Ambulatory Visit: Payer: Self-pay

## 2019-10-21 ENCOUNTER — Telehealth (INDEPENDENT_AMBULATORY_CARE_PROVIDER_SITE_OTHER): Payer: Managed Care, Other (non HMO) | Admitting: Family Medicine

## 2019-10-21 VITALS — Temp 98.2°F | Ht 69.0 in | Wt 240.0 lb

## 2019-10-21 DIAGNOSIS — E669 Obesity, unspecified: Secondary | ICD-10-CM | POA: Diagnosis not present

## 2019-10-21 DIAGNOSIS — K219 Gastro-esophageal reflux disease without esophagitis: Secondary | ICD-10-CM | POA: Diagnosis not present

## 2019-10-21 DIAGNOSIS — U071 COVID-19: Secondary | ICD-10-CM

## 2019-10-21 HISTORY — DX: COVID-19: U07.1

## 2019-10-21 MED ORDER — OMEPRAZOLE 40 MG PO CPDR: 40.0000 mg | DELAYED_RELEASE_CAPSULE | Freq: Every day | ORAL | 3 refills | Status: DC

## 2019-10-21 MED ORDER — FAMOTIDINE 40 MG PO TABS: 40.0000 mg | ORAL_TABLET | Freq: Every day | ORAL | 11 refills | Status: DC

## 2019-10-21 NOTE — Progress Notes (Signed)
Virtual visit completed through MyChart, a video enabled telemedicine application. Due to national recommendations of social distancing due to COVID-19, a virtual visit is felt to be most appropriate for this patient at this time. Reviewed limitations, risks, security and privacy concerns of performing a virtual visit and the availability of in person appointments. I also reviewed that there may be a patient responsible charge related to this service. The patient agreed to proceed.   Patient location: office at work Provider location: Financial controller at [facility] [employer], in [place] office at work Persons participating in this virtual visit: patient, provider   If any vitals were documented, they were collected by patient at home unless specified below.    Temp 98.2 F (36.8 C)   Ht 5\' 9"  (1.753 m)   Wt 240 lb (108.9 kg)   BMI 35.44 kg/m    CC: diarrhea, body aches and chills, presumed COVID Subjective:    Patient ID: Phillip Evans, male    DOB: Sep 04, 1970, 49 y.o.   MRN: [id number]  HPI: Phillip Evans is a 49 y.o. male presenting on 10/21/2019 for Diarrhea (C/o diarrhea, body aches and chills.  Denies any fever, cough, SOB, etc.  Sxs started 10/19/19.  Pt did not go to UC and has not been tested for COVID, despite son testing positive on 10/18/19.) and Gastroesophageal Reflux (C/o continued acid in his chest and throat.  Tries to avoid spicy foods and takes med daily.  Sxs are really bad he misses a dose.  Requests referral to GI for EGD. )   Son dx COVID 10/18/2019.  First day of symptoms for patient 10/19/2019.  Flushed, chills, stomach upset, body aches, HA, diarrhea.  No respiratory symptoms, cough, ST, dyspnea.  No h/o asthma, non smoker.  Managing with tylenol.  He did get Pfizer vaccine 04/2019 x2  Also - ongoing worsening GERD since trip to [place] over the summer despite regularly taking omeprazole 40mg  daily + pepcid PRN (had stopped using pepcid). Already avoids spicy foods.  Longstanding  prilosec 20mg  use OTC for 20 yrs.  Remote EGD WNL per pt.  Has not been checked for H pylori.      Relevant past medical, surgical, family and social history reviewed and updated as indicated. Interim medical history since our last visit reviewed. Allergies and medications reviewed and updated. Outpatient Medications Prior to Visit  Medication Sig Dispense Refill  . erythromycin ophthalmic ointment Place 1 application into the left eye at bedtime. 3.5 g 0  . vitamin B-12 (CYANOCOBALAMIN) 1000 MCG tablet Take 1 tablet (1,000 mcg total) by mouth daily.    Marland Kitchen omeprazole (PRILOSEC) 40 MG capsule Take 1 capsule (40 mg total) by mouth daily. 30 capsule 1  . omeprazole (PRILOSEC OTC) 20 MG tablet Take 20 mg by mouth daily.      No facility-administered medications prior to visit.     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 Ongoing symptoms despite daily omeprazole 40mg  breakthrough depending ond iet. Add pepcid 40mg  at night. Refer to GI for further eval.       Relevant Medications   omeprazole (PRILOSEC) 40 MG capsule   famotidine (PEPCID) 40 MG tablet   Other Relevant Orders   Ambulatory referral to Gastroenterology   COVID-19 virus infection - Primary    Presumed covid-19 infection with recent exposure (son)  and covid symptoms. Did suggest he get tested to qualify for mAb infusion which he would be interested in. Supportive care reviewed. He will let us know test results in interim.  I did discuss home quarantine for 10 days from symptom onset. Will provide letter for work.  Would qualify for mAb infusion due to BMI.           Meds ordered this encounter  Medications  . omeprazole (PRILOSEC) 40 MG capsule    Sig: Take 1 capsule (40 mg total) by mouth daily.    Dispense:  90 capsule    Refill:  3  . famotidine (PEPCID) 40 MG tablet    Sig: Take 1 tablet (40 mg total) by mouth at bedtime.    Dispense:  30 tablet    Refill:  11   Orders Placed This Encounter  Procedures  . Ambulatory referral to Gastroenterology    Referral Priority:   Routine    Referral Type:   Consultation    Referral Reason:   Specialty Services Required    Number of Visits Requested:   1    I discussed the assessment and treatment plan with the patient. The patient was provided an opportunity to ask questions and all were answered. The patient agreed with the plan and demonstrated an understanding of the instructions. The patient was advised to call back or seek an in-person evaluation if the symptoms worsen or if the condition fails to improve as anticipated.  Follow up plan: No follow-ups on file.  Ria Bush, MD

## 2019-10-21 NOTE — Assessment & Plan Note (Addendum)
Presumed covid-19 infection with recent exposure (son) and covid symptoms. Did suggest he get tested to qualify for mAb infusion which he would be interested in. Supportive care reviewed. He will let us know test results in interim.  I did discuss home quarantine for 10 days from symptom onset. Will provide letter for work.  Would qualify for mAb infusion due to BMI.

## 2019-10-21 NOTE — Telephone Encounter (Signed)
Spoke with pt asking for update on sxs.  States he did not go to UC.  C/o minor diarrhea, body aches and chills.  Denies fever, cough, SOB, etc.  Scheduled MyChart visit today at 12:30.  Pt also c/o continued acid reflux.  States he tries not to eat spicy foods and takes med daily.

## 2019-10-21 NOTE — Assessment & Plan Note (Signed)
Longstanding GERD, acutely worse after trip to [place] over summer 2021. Ongoing symptoms despite daily omeprazole 40mg  breakthrough depending ond iet. Add pepcid 40mg  at night. Refer to GI for further eval.

## 2019-11-18 ENCOUNTER — Ambulatory Visit: Payer: Managed Care, Other (non HMO) | Admitting: Internal Medicine

## 2019-11-18 ENCOUNTER — Telehealth: Payer: Self-pay | Admitting: *Deleted

## 2019-11-18 MED ORDER — SUCRALFATE 1 GM/10ML PO SUSP
1.0000 g | Freq: Three times a day (TID) | ORAL | 0 refills | Status: DC
Start: 2019-11-18 — End: 2019-11-24

## 2019-11-18 NOTE — Telephone Encounter (Signed)
Patient advised.

## 2019-11-18 NOTE — Telephone Encounter (Signed)
I would try changing to sucralfate in the meantime, to see if that helps. He can take that along with prilosec.  If that isn't helping, then let us know next week.  Thanks.

## 2019-11-18 NOTE — Telephone Encounter (Signed)
Pt left a message at Triage. Pt is wanting to know what he can do about his worsening GERD. He said it is severe since recovering from Covid and getting his taste back. Pt said he can "taste the acid in the back of his throat" pt said it is bad and he has tried all OTC and Rx meds and nothing is helping. Pt said yesterday he woke up and took his omeprazole that didn't help so around lunch time he took OTC Zantac, still no relief he took the Pepcid and then near bedtime he took another Zantac, he said today is about the same also, he is taking GERD meds multiple times a day and the GERD is still worsening. Pt said he has cut out all spicy foods, no sodas and only drinks water but nothing is helping. Would like something stronger to help him with GERD  [facility]  Will route to PCP and Dr. Damita Dunnings

## 2019-11-18 NOTE — Telephone Encounter (Signed)
Pt called back; pt said having issues with GERD since June or July 2021. Pt is not have CP or SOB; pt said omeprazole and pepcid is not helping GERD(the burning in his throat) at all. prilosec and nexium not helping burning at back of throat. Pt is not in distress;but wants to know if there is a different or new med he can be put on for the GERD and burning in the back of his throat. Pt request cb after reviewed.

## 2019-11-21 MED ORDER — DEXILANT 60 MG PO CPDR: 60.0000 mg | DELAYED_RELEASE_CAPSULE | Freq: Every day | ORAL | 3 refills | Status: DC

## 2019-11-21 NOTE — Telephone Encounter (Signed)
Spoke with pt relaying Dr. Synthia Innocent message.  Pt verbalizes understanding.  Says he has not seen [facility] yet.  Informed pt they have been trying to contact him.  I provided their #, pt will call now to schedule.

## 2019-11-21 NOTE — Addendum Note (Signed)
Addended by: Ria Bush on: 11/21/2019 12:04 AM   Modules accepted: Orders

## 2019-11-21 NOTE — Telephone Encounter (Addendum)
Agree. Will also send in dexilant for him to take in place of omeprazole and nexium. Will see if insurance will cover. Has he scheduled appt with GI? (referral placed 10/21/2019) - looks like GI office called him but they were unable to reach him. plz provide with GI # if needed.  New regimen will be sucralfate (carafate), dexilant, and pepcid PRN.

## 2019-11-24 ENCOUNTER — Other Ambulatory Visit: Payer: Self-pay

## 2019-11-24 ENCOUNTER — Encounter: Payer: Self-pay | Admitting: Internal Medicine

## 2019-11-24 ENCOUNTER — Ambulatory Visit (INDEPENDENT_AMBULATORY_CARE_PROVIDER_SITE_OTHER): Payer: Managed Care, Other (non HMO) | Admitting: Internal Medicine

## 2019-11-24 VITALS — BP 126/82 | HR 78 | Temp 98.0°F | Wt 240.0 lb

## 2019-11-24 DIAGNOSIS — K219 Gastro-esophageal reflux disease without esophagitis: Secondary | ICD-10-CM | POA: Diagnosis not present

## 2019-11-24 NOTE — Progress Notes (Signed)
Subjective:    Patient ID: Phillip Evans, male    DOB: 20-Aug-1970, 49 y.o.   MRN: [id number]  HPI  Pt presents to the clinic today for follow up GERD. He has been having a burning sensation in the epigastric region since May 2021. He was originally on Omeprazole 40 mg. Famotidine 40 mg was added at bedtime. That did not provide any relief, so his Omeprazole was switched to Esomeprazole. He was referred to GI but never scheduled this appt. He called back to the office, c/o persistent symptoms. Sucralfate was sent in (which he stopped taking because he felt like it did not help), Lansoprazole was added in addition to Esomeprazole and Famotidine. He was advised to reach out to GI to schedule his appt, which is scheduled for 12/7. He has also tried G[facility]er OTC. His appetite varies, because of the reflux. He has cut out sodas. He has tried to consume a low carb diet, avoiding fried foods. He does not drink alcohol. His bowels are moving normally. He did have Covid 10/2, and he reports his reflux has been worse since that time. He denies chest pain or shortness of breath. He denies sore throat, but can taste the acid in the back of his throat. He denies cough. He reports his symptoms are worse first thing in the morning. He denies swallowing issues.   Review of Systems  Past Medical History:  Diagnosis Date  . GERD (gastroesophageal reflux disease)   . Lactose intolerance   . Obesity     Current Outpatient Medications  Medication Sig Dispense Refill  . dexlansoprazole (DEXILANT) 60 MG capsule Take 1 capsule (60 mg total) by mouth daily. 30 capsule 3  . erythromycin ophthalmic ointment Place 1 application into the left eye at bedtime. 3.5 g 0  . famotidine (PEPCID) 40 MG tablet Take 1 tablet (40 mg total) by mouth at bedtime. 30 tablet 11  . omeprazole (PRILOSEC) 40 MG capsule Take 1 capsule (40 mg total) by mouth daily. 90 capsule 3  . sucralfate (CARAFATE) 1 GM/10ML suspension Take 10  mLs (1 g total) by mouth 4 (four) times daily -  with meals and at bedtime. 420 mL 0  . vitamin B-12 (CYANOCOBALAMIN) 1000 MCG tablet Take 1 tablet (1,000 mcg total) by mouth daily.     No current facility-administered medications for this visit.    Allergies  Allergen Reactions  . Oxycodone Nausea Only  . Sulfur Hives  . Tetanus Toxoids Other (See Comments)    Swollen, red arm    Family History  Problem Relation Age of Onset  . Diabetes Father   . Cancer Maternal Grandmother        stomach cancer  . Coronary artery disease Maternal Grandfather 21       CAD/MI  . Cancer Maternal Grandfather        unsure which  . Hypertension Maternal Grandfather   . ALS Maternal Grandfather   . Hypertension Paternal Grandfather     Social History   Socioeconomic History  . Marital status: Married    Spouse name: Not on file  . Number of children: 3  . Years of education: college  . Highest education level: Not on file  Occupational History  . Occupation: Psychologist, occupational: OTHER  Tobacco Use  . Smoking status: Never Smoker  . Smokeless tobacco: Never Used  Substance and Sexual Activity  . Alcohol use: No    Alcohol/week: 0.0 standard drinks  .  Drug use: No  . Sexual activity: Not on file  Other Topics Concern  . 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 Difficulty of Paying Living Expenses: Not on file  Food Insecurity:   . Worried About Charity fundraiser in the Last Year: Not on file  . Ran Out of Food in the Last Year: Not on file  Transportation Needs:   . Lack of Transportation (Medical): Not on file  . Lack of Transportation (Non-Medical): Not on file  Physical Activity:   . Days of Exercise per Week: Not on file  . Minutes of Exercise per Session: Not on file    Stress:   . Feeling of Stress : Not on file  Social Connections:   . Frequency of Communication with Friends and Family: Not on file  . Frequency of Social Gatherings with Friends and Family: Not on file  . Attends Religious Services: Not on file  . Active Member of Clubs or Organizations: Not on file  . Attends Archivist Meetings: Not on file  . Marital Status: Not on file  Intimate Partner Violence:   . Fear of Current or Ex-Partner: Not on file  . Emotionally Abused: Not on file  . Physically Abused: Not on file  . Sexually Abused: Not on file     Constitutional: Denies fever, malaise, fatigue, headache or abrupt weight changes.  Respiratory: Denies difficulty breathing, shortness of breath, cough or sputum production.   Cardiovascular: Denies chest pain, chest tightness, palpitations or swelling in the hands or feet.  Gastrointestinal: Pt reports epigastric pain. Denies bloating, constipation, diarrhea or blood in the stool.   No other specific complaints in a complete review of systems (except as listed in HPI above).     Objective:   Physical Exam BP 126/82   Pulse 78   Temp 98 F (36.7 C) (Temporal)   Wt 240 lb (108.9 kg)   SpO2 98%   BMI 35.44 kg/m   Wt Readings from Last 3 Encounters:  10/21/19 240 lb (108.9 kg)  07/05/19 246 lb 3 oz (111.7 kg)  07/04/19 248 lb 3.8 oz (112.6 kg)    General: Appears his stated age, obese in NAD. HEENT: Head: normal shape and size; Neck:  Neck supple, trachea midline. No masses, lumps or thyromegaly present.  Cardiovascular: Normal rate and rhythm. S1,S2 noted.  No murmur, rubs or gallops noted.  Pulmonary/Chest: Normal effort and positive vesicular breath sounds. No respiratory distress. No wheezes, rales or ronchi noted.  Abdomen: Soft and nontender. Normal bowel sounds. No distention or masses noted.  Neurological: Alert and oriented.    BMET    Component Value Date/Time   NA 138 07/04/2019 0941   K 3.9  07/04/2019 0941   CL 103 07/04/2019 0941   CO2 25 07/04/2019 0941   GLUCOSE 117 (H) 07/04/2019 0941   BUN 17 07/04/2019 0941   CREATININE 1.15 07/04/2019 0941   CALCIUM 9.4 07/04/2019 0941   GFRNONAA >60 07/04/2019 0941   GFRAA >60 07/04/2019 0941    Lipid Panel     Component Value Date/Time   CHOL 223 (H) 06/12/2010 1022   TRIG 198.0 (H) 06/12/2010 1022   HDL 43.10 06/12/2010 1022   CHOLHDL 5 06/12/2010 1022  VLDL 39.6 06/12/2010 1022    CBC    Component Value Date/Time   WBC 6.3 07/04/2019 0941   RBC 5.25 07/04/2019 0941   HGB 14.5 07/04/2019 0941   HCT 42.1 07/04/2019 0941   PLT 234 07/04/2019 0941   MCV 80.2 07/04/2019 0941   MCH 27.6 07/04/2019 0941   MCHC 34.4 07/04/2019 0941   RDW 13.0 07/04/2019 0941   LYMPHSABS 1.9 12/29/2016 0852   MONOABS 0.4 12/29/2016 0852   EOSABS 0.1 12/29/2016 0852   BASOSABS 0.0 12/29/2016 0852    Hgb A1C Lab Results  Component Value Date   HGBA1C 5.4 08/28/2015            Assessment & Plan:   GERD:  Persistent, not relieved by multiple medications Advised him to restart the Sucralfate, in addition to Esomeprazole, Lansoprazole and Famotidine. I think he needs an upper endoscopy, but appt is not scheduled until 12/7- will try to see if we could have him seen sooner Will discuss with PCP, to see if he has any other recommendations for treatment at this time.  Will follow up after I discuss with PCP, return precautions discussed Webb Silversmith, NP This visit occurred during the SARS-CoV-2 public health emergency.  Safety protocols were in place, including screening questions prior to the visit, additional usage of staff PPE, and extensive cleaning of exam room while observing appropriate contact time as indicated for disinfecting solutions.

## 2019-11-24 NOTE — Patient Instructions (Signed)

## 2019-11-29 ENCOUNTER — Telehealth: Payer: Self-pay | Admitting: Family Medicine

## 2019-11-29 ENCOUNTER — Other Ambulatory Visit: Payer: Self-pay | Admitting: Internal Medicine

## 2019-11-29 DIAGNOSIS — K219 Gastro-esophageal reflux disease without esophagitis: Secondary | ICD-10-CM

## 2019-11-29 NOTE — Telephone Encounter (Signed)
Spoke to pt. Advised him what Rollene Fare said. He said he has been doing Dexilant, Nexium, and Sucralfate (1 in am and 1 in pm) and has been doing better. Only seems to be flaring up if he does not have anything in his stomach. Once he eats it settles down. Not drinking sodas. Seeing GI December 7th. Asking if he should just wait until then to do anything.

## 2019-11-29 NOTE — Telephone Encounter (Signed)
D/w PCP. Will stop Esomeprazole. Continue Lansoprazole, Sucralfate and Famotidine. Needs H Pylori testing with upper GI. [place]- can we reach out to GI to see if there is any way they can see him sooner?

## 2019-11-29 NOTE — Telephone Encounter (Signed)
Spoke with [place]. Agree with IgG H pylori serology although not ideal likely best test with ongoing PPI use. Agree with expediting GI eval (have been recommending this for months).

## 2019-11-29 NOTE — Telephone Encounter (Signed)
Have him schedule lab only appt for H Pylori testing

## 2019-11-29 NOTE — Telephone Encounter (Signed)
Dr. Darnell Level wants him to stop he Nexium and come to the lab for H pylori testing

## 2019-11-29 NOTE — Telephone Encounter (Signed)
I cc'd chart to PCP, waiting on response. Will touch base with PCP today and get back with pt as soon as I can

## 2019-11-29 NOTE — Telephone Encounter (Signed)
Spoke to pt. Made lab appt for next Tuesday.

## 2019-11-29 NOTE — Telephone Encounter (Signed)
Pt called in wanted to know about the medication follow up wanted to know if he was prescribed anything else and wanted to know what he should be doing. Please advise.

## 2019-11-30 NOTE — Telephone Encounter (Signed)
Noted  

## 2019-12-06 ENCOUNTER — Other Ambulatory Visit: Payer: Managed Care, Other (non HMO)

## 2019-12-07 ENCOUNTER — Other Ambulatory Visit: Payer: Self-pay

## 2019-12-07 ENCOUNTER — Other Ambulatory Visit (INDEPENDENT_AMBULATORY_CARE_PROVIDER_SITE_OTHER): Payer: Managed Care, Other (non HMO)

## 2019-12-07 DIAGNOSIS — K219 Gastro-esophageal reflux disease without esophagitis: Secondary | ICD-10-CM

## 2019-12-07 LAB — H. PYLORI ANTIBODY, IGG: H Pylori IgG: NEGATIVE

## 2019-12-29 ENCOUNTER — Telehealth: Payer: Self-pay

## 2019-12-29 NOTE — Telephone Encounter (Signed)
Received faxed Medication Change Authorization form from [place].  Placed form in Dr. Synthia Innocent box.

## 2019-12-30 NOTE — Telephone Encounter (Signed)
Filled and in lisa's box.  

## 2019-12-30 NOTE — Telephone Encounter (Signed)
Faxed form.

## 2020-01-03 ENCOUNTER — Encounter: Payer: Self-pay | Admitting: Gastroenterology

## 2020-01-03 ENCOUNTER — Ambulatory Visit: Payer: Managed Care, Other (non HMO) | Admitting: Gastroenterology

## 2020-01-03 VITALS — BP 130/78 | HR 74 | Ht 69.0 in | Wt 239.0 lb

## 2020-01-03 DIAGNOSIS — K219 Gastro-esophageal reflux disease without esophagitis: Secondary | ICD-10-CM | POA: Diagnosis not present

## 2020-01-03 DIAGNOSIS — Z1211 Encounter for screening for malignant neoplasm of colon: Secondary | ICD-10-CM | POA: Diagnosis not present

## 2020-01-03 DIAGNOSIS — Z1212 Encounter for screening for malignant neoplasm of rectum: Secondary | ICD-10-CM

## 2020-01-03 MED ORDER — PLENVU 140 G PO SOLR
140.0000 g | ORAL | 0 refills | Status: DC
Start: 2020-01-03 — End: 2020-02-27

## 2020-01-03 NOTE — Progress Notes (Signed)
[facility] Consult Note:  History: Phillip Evans 01/03/2020  Referring provider: Ria Bush, MD  Reason for consult/chief complaint: Heartburn (pt reports heartburn previously controlled by OTC Prilosec has been increasing in severity - especially with spicy food; Dexilant has helped some as well as some diet changes but still has some breakthrough symptoms)   Subjective  HPI:  This is a very pleasant 49 year old man referred by primary care for worsening reflux.  He reports decades of heartburn that was usually controlled on as needed dose of OTC H2 blockers or PPI.  He would get some flare of symptoms with certain food triggers.  His heartburns been worse in the last several months for unclear reasons, though he does report some increased work-related stress in the last couple of years.  His symptoms were more severe after he had a GI illness as last day vacationing in [place], and also for some period of time after Covid infection a few months ago.  Curiously, he does not sense any nocturnal regurgitation or heartburn.  He denies dysphagia odynophagia nausea or vomiting.  Escalating doses of Dexilant and Pepcid have had limited benefit.   ROS:  Review of Systems  Constitutional: Negative for appetite change and unexpected weight change.  HENT: Negative for mouth sores and voice change.   Eyes: Negative for pain and redness.  Respiratory: Negative for cough and shortness of breath.   Cardiovascular: Negative for chest pain and palpitations.  Genitourinary: Negative for dysuria and hematuria.  Musculoskeletal: Negative for arthralgias and myalgias.  Skin: Negative for pallor and rash.  Neurological: Negative for weakness and headaches.  Hematological: Negative for adenopathy.     Past Medical History: Past Medical History:  Diagnosis Date  . GERD (gastroesophageal reflux disease)   . Lactose intolerance   . Obesity      Past Surgical  History: Past Surgical History:  Procedure Laterality Date  . ESOPHAGOGASTRODUODENOSCOPY  2000   WNL per pt  . R hand surgery  1994   with tendon reconstruction     Family History: Family History  Problem Relation Age of Onset  . Diabetes Father   . Cancer Maternal Grandmother        stomach cancer  . Coronary artery disease Maternal Grandfather 78       CAD/MI  . Cancer Maternal Grandfather        unsure which  . Hypertension Maternal Grandfather   . ALS Maternal Grandfather   . Hypertension Paternal Grandfather     Social History: Social History   Socioeconomic History  . Marital status: Married    Spouse name: Not on file  . Number of children: 3  . Years of education: college  . Highest education level: Not on file  Occupational History  . Occupation: Psychologist, occupational: OTHER  Tobacco Use  . Smoking status: Never Smoker  . Smokeless tobacco: Never Used  Substance and Sexual Activity  . Alcohol use: No    Alcohol/week: 0.0 standard drinks  . Drug use: No  . Sexual activity: Not on file  Other Topics Concern  . 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 Difficulty of Paying Living Expenses: Not on file  Food Insecurity:   . Worried About Running  Out of Food in the Last Year: Not on file  . Ran Out of Food in the Last Year: Not on file  Transportation Needs:   . Lack of Transportation (Medical): Not on file  . Lack of Transportation (Non-Medical): Not on file  Physical Activity:   . Days of Exercise per Week: Not on file  . Minutes of Exercise per Session: Not on file  Stress:   . Feeling of Stress : Not on file  Social Connections:   . Frequency of Communication with Friends and Family: Not on file  . Frequency of Social Gatherings with Friends and Family:  Not on file  . Attends Religious Services: Not on file  . Active Member of Clubs or Organizations: Not on file  . Attends Archivist Meetings: Not on file  . Marital Status: Not on file    Allergies: Allergies  Allergen Reactions  . Oxycodone Nausea Only  . Sulfur Hives  . Tetanus Toxoids Other (See Comments)    Swollen, red arm    Outpatient Meds: Current Outpatient Medications  Medication Sig Dispense Refill  . dexlansoprazole (DEXILANT) 60 MG capsule Take 1 capsule (60 mg total) by mouth daily. 30 capsule 3  . famotidine (PEPCID) 40 MG tablet Take 1 tablet (40 mg total) by mouth at bedtime. 30 tablet 11  . PEG-KCl-NaCl-NaSulf-Na Asc-C (PLENVU) 140 g SOLR Take 140 g by mouth as directed. 1 each 0   No current facility-administered medications for this visit.      ___________________________________________________________________ Objective   Exam:  BP 130/78   Pulse 74   Ht 5\' 9"  (1.753 m)   Wt 239 lb (108.4 kg)   BMI 35.29 kg/m    General: Well-appearing, normal vocal quality  Eyes: sclera anicteric, no redness  ENT: oral mucosa moist without lesions, no cervical or supraclavicular lymphadenopathy  CV: RRR without murmur, S1/S2, no JVD, no peripheral edema  Resp: clear to auscultation bilaterally, normal RR and effort noted  GI: soft, no tenderness, with active bowel sounds. No guarding or palpable organomegaly noted.  Skin; warm and dry, no rash or jaundice noted  Neuro: awake, alert and oriented x 3. Normal gross motor function and fluent speech  Labs:  No pertinent data for review Assessment: Encounter Diagnoses  Name Primary?  . Gastroesophageal reflux disease without esophagitis Yes  . Encounter for colorectal cancer screening     Longstanding reflux symptoms worse over last several months for unclear reasons.  Primarily heartburn more than regurgitation, and curiously no nocturnal symptoms (or at least not that he is aware of).  No  chronic cough dysphagia or vomiting.  We discussed the limitation of acid suppression therapy, breadth of diet and lifestyle changes required for reflux control, the possibility of reflux related complications such as esophagitis, stricture or Barrett's esophagus.  Other anatomic considerations such as gastric outlet obstruction or hiatal hernia may contribute to reflux.  We also discussed the role of regurgitation of weekly acidic or nonacidic contents which may also partially explain insufficient response to acid suppression medicines.  Plan:  Upper endoscopy. Screening colonoscopy.  He was agreeable after discussion of procedure and risks.  The benefits and risks of the planned procedure were described in detail with the patient or (when appropriate) their health care proxy.  Risks were outlined as including, but not limited to, bleeding, infection, perforation, adverse medication reaction leading to cardiac or pulmonary decompensation, pancreatitis (if ERCP).  The limitation of incomplete mucosal visualization was also discussed.  No guarantees or warranties were given.  Depending on EGD results, may need pH and impedance testing off meds afterwards.  Thank you for the courtesy of this consult.  Please call me with any questions or concerns.  Nelida Meuse III  CC: Referring provider noted above

## 2020-01-03 NOTE — Patient Instructions (Addendum)
If you are age 47 or older, your body mass index should be between 23-30. Your Body mass index is 35.29 kg/m. If this is out of the aforementioned range listed, please consider follow up with your Primary Care Provider.  If you are age 9 or younger, your body mass index should be between 19-25. Your Body mass index is 35.29 kg/m. If this is out of the aformentioned range listed, please consider follow up with your Primary Care Provider.   You have been scheduled for an endoscopy and colonoscopy. Please follow the written instructions given to you at your visit today. Please pick up your prep supplies at the pharmacy within the next 1-3 days. If you use inhalers (even only as needed), please bring them with you on the day of your procedure.  It was a pleasure to see you today!  Dr. Loletha Carrow

## 2020-01-28 HISTORY — PX: ESOPHAGOGASTRODUODENOSCOPY: SHX1529

## 2020-01-28 HISTORY — PX: COLONOSCOPY: SHX174

## 2020-02-27 ENCOUNTER — Encounter: Payer: Self-pay | Admitting: Gastroenterology

## 2020-02-27 ENCOUNTER — Other Ambulatory Visit: Payer: Self-pay

## 2020-02-27 ENCOUNTER — Ambulatory Visit (AMBULATORY_SURGERY_CENTER): Payer: Managed Care, Other (non HMO) | Admitting: Gastroenterology

## 2020-02-27 VITALS — BP 120/69 | HR 57 | Temp 97.7°F | Resp 13 | Ht 69.0 in | Wt 239.0 lb

## 2020-02-27 DIAGNOSIS — D123 Benign neoplasm of transverse colon: Secondary | ICD-10-CM | POA: Diagnosis not present

## 2020-02-27 DIAGNOSIS — Z1211 Encounter for screening for malignant neoplasm of colon: Secondary | ICD-10-CM

## 2020-02-27 DIAGNOSIS — D122 Benign neoplasm of ascending colon: Secondary | ICD-10-CM | POA: Diagnosis not present

## 2020-02-27 DIAGNOSIS — K317 Polyp of stomach and duodenum: Secondary | ICD-10-CM | POA: Diagnosis not present

## 2020-02-27 DIAGNOSIS — K219 Gastro-esophageal reflux disease without esophagitis: Secondary | ICD-10-CM

## 2020-02-27 MED ORDER — SODIUM CHLORIDE 0.9 % IV SOLN
500.0000 mL | Freq: Once | INTRAVENOUS | Status: DC
Start: 2020-02-27 — End: 2020-02-27

## 2020-02-27 NOTE — Progress Notes (Signed)
Called to room to assist during endoscopic procedure.  Patient ID and intended procedure confirmed with present staff. Received instructions for my participation in the procedure from the performing physician.  

## 2020-02-27 NOTE — Progress Notes (Signed)
PT taken to PACU. Monitors in place. VSS. Report given to RN. 

## 2020-02-27 NOTE — Op Note (Signed)
[place] Patient Name: Phillip Evans Procedure Date: 02/27/2020 12:33 PM MRN: [id number] Endoscopist: Mallie Mussel L. Loletha Carrow , MD Age: 50 Referring MD:  Date of Birth: [birth year] Gender: Male Account #: [id number] Procedure:                Upper GI endoscopy Indications:              Esophageal reflux symptoms that recur despite                            appropriate therapy (symptoms lately improved                            allowing decrease in acid suppression medicine-                            patient attributes improvement to diet changes and                            decreased stress) Medicines:                Monitored Anesthesia Care Procedure:                Pre-Anesthesia Assessment:                           - Prior to the procedure, a History and Physical                            was performed, and patient medications and                            allergies were reviewed. The patient's tolerance of                            previous anesthesia was also reviewed. The risks                            and benefits of the procedure and the sedation                            options and risks were discussed with the patient.                            All questions were answered, and informed consent                            was obtained. Prior Anticoagulants: The patient has                            taken no previous anticoagulant or antiplatelet                            agents. ASA Grade Assessment: II - A patient with  mild systemic disease. After reviewing the risks                            and benefits, the patient was deemed in                            satisfactory condition to undergo the procedure.                           After obtaining informed consent, the endoscope was                            passed under direct vision. Throughout the                            procedure, the patient's blood pressure, pulse, and                             oxygen saturations were monitored continuously. The                            Endoscope was introduced through the mouth, and                            advanced to the second part of duodenum. The upper                            GI endoscopy was accomplished without difficulty.                            The patient tolerated the procedure well. Scope In: Scope Out: Findings:                 The esophagus was normal.                           There is no endoscopic evidence of Barrett's                            esophagus, esophagitis or mucosal abnormalities in                            the entire esophagus.                           Multiple medium pedunculated and sessile fundic                            gland polyps were found in the gastric fundus and                            in the gastric body.                           The cardia and gastric fundus were normal on  retroflexion. (Hill Grade 1)                           The examined duodenum was normal. Complications:            No immediate complications. Estimated Blood Loss:     Estimated blood loss: none. Impression:               - Normal esophagus.                           - Multiple fundic gland polyps. Common and benign                            finding related to chronic use of acid suppression                            medicine.                           - Normal examined duodenum.                           - No specimens collected. Recommendation:           - Patient has a contact number available for                            emergencies. The signs and symptoms of potential                            delayed complications were discussed with the                            patient. Return to normal activities tomorrow.                            Written discharge instructions were provided to the                            patient.                            - Resume previous diet.                           - Continue present medications.                           - Follow an antireflux regimen indefinitely. This                            is at least as important as medicine to control                            GERD symptoms.                           -  See the other procedure note for documentation of                            additional recommendations. Anberlyn Feimster L. Loletha Carrow, MD 02/27/2020 2:00:42 PM This report has been signed electronically.

## 2020-02-27 NOTE — Progress Notes (Signed)
JB- Check-in  CW - VS   

## 2020-02-27 NOTE — Op Note (Signed)
[place] Patient Name: Phillip Evans Procedure Date: 02/27/2020 12:34 PM MRN: [id number] Endoscopist: Mallie Mussel L. Loletha Carrow , MD Age: 50 Referring MD:  Date of Birth: [birth year] Gender: Male Account #: [id number] Procedure:                Colonoscopy Indications:              Screening for colorectal malignant neoplasm, This                            is the patient's first colonoscopy Medicines:                Monitored Anesthesia Care Procedure:                Pre-Anesthesia Assessment:                           - Prior to the procedure, a History and Physical                            was performed, and patient medications and                            allergies were reviewed. The patient's tolerance of                            previous anesthesia was also reviewed. The risks                            and benefits of the procedure and the sedation                            options and risks were discussed with the patient.                            All questions were answered, and informed consent                            was obtained. Prior Anticoagulants: The patient has                            taken no previous anticoagulant or antiplatelet                            agents. ASA Grade Assessment: II - A patient with                            mild systemic disease. After reviewing the risks                            and benefits, the patient was deemed in                            satisfactory condition to undergo the procedure.  After obtaining informed consent, the colonoscope                            was passed under direct vision. Throughout the                            procedure, the patient's blood pressure, pulse, and                            oxygen saturations were monitored continuously. The                            Olympus CF-HQ190 [id number]) Colonoscope was                            introduced through the anus and  advanced to the the                            cecum, identified by appendiceal orifice and                            ileocecal valve. The colonoscopy was performed                            without difficulty. The patient tolerated the                            procedure well. The quality of the bowel                            preparation was excellent. The ileocecal valve,                            appendiceal orifice, and rectum were photographed. Scope In: 1:34:10 PM Scope Out: 1:44:06 PM Scope Withdrawal Time: 0 hours 8 minutes 54 seconds  Total Procedure Duration: 0 hours 9 minutes 56 seconds  Findings:                 The perianal and digital rectal examinations were                            normal.                           Two sessile polyps were found in the mid transverse                            colon and distal ascending colon. The polyps were 3                            to 5 mm in size. These polyps were removed with a                            cold snare. Resection and retrieval were complete.  The exam was otherwise without abnormality on                            direct and retroflexion views. Complications:            No immediate complications. Estimated Blood Loss:     Estimated blood loss was minimal. Impression:               - Two 3 to 5 mm polyps in the mid transverse colon                            and in the distal ascending colon, removed with a                            cold snare. Resected and retrieved.                           - The examination was otherwise normal on direct                            and retroflexion views. Recommendation:           - Patient has a contact number available for                            emergencies. The signs and symptoms of potential                            delayed complications were discussed with the                            patient. Return to normal activities tomorrow.                             Written discharge instructions were provided to the                            patient.                           - Resume previous diet.                           - Continue present medications.                           - Await pathology results.                           - Repeat colonoscopy is recommended for                            surveillance. The colonoscopy date will be                            determined after pathology results from today's  exam become available for review.                           - See the other procedure note for documentation of                            additional recommendations. [name], MD 02/27/2020 1:55:35 PM This report has been signed electronically.

## 2020-02-27 NOTE — Patient Instructions (Signed)
Impression/Recommendations:  GERD and polyp handouts given to patient.  Follow antireflux regimen.  Resume previous diet. Continue present medications. Await pathology results.  Repeat colonoscopy recommended for surveillance.  Date to be determined after pathology results reviewed.  YOU HAD AN ENDOSCOPIC PROCEDURE TODAY AT [facility]:   Refer to the procedure report that was given to you for any specific questions about what was found during the examination.  If the procedure report does not answer your questions, please call your gastroenterologist to clarify.  If you requested that your care partner not be given the details of your procedure findings, then the procedure report has been included in a sealed envelope for you to review at your convenience later.  YOU SHOULD EXPECT: Some feelings of bloating in the abdomen. Passage of more gas than usual.  Walking can help get rid of the air that was put into your GI tract during the procedure and reduce the bloating. If you had a lower endoscopy (such as a colonoscopy or flexible sigmoidoscopy) you may notice spotting of blood in your stool or on the toilet paper. If you underwent a bowel prep for your procedure, you may not have a normal bowel movement for a few days.  Please Note:  You might notice some irritation and congestion in your nose or some drainage.  This is from the oxygen used during your procedure.  There is no need for concern and it should clear up in a day or so.  SYMPTOMS TO REPORT IMMEDIATELY:   Following lower endoscopy (colonoscopy or flexible sigmoidoscopy):  Excessive amounts of blood in the stool  Significant tenderness or worsening of abdominal pains  Swelling of the abdomen that is new, acute  Fever of 100F or higher   Following upper endoscopy (EGD)  Vomiting of blood or coffee ground material  New chest pain or pain under the shoulder blades  Painful or persistently difficult  swallowing  New shortness of breath  Fever of 100F or higher  Black, tarry-looking stools  For urgent or emergent issues, a gastroenterologist can be reached at any hour by calling [phone]. Do not use MyChart messaging for urgent concerns.    DIET:  We do recommend a small meal at first, but then you may proceed to your regular diet.  Drink plenty of fluids but you should avoid alcoholic beverages for 24 hours.  ACTIVITY:  You should plan to take it easy for the rest of today and you should NOT DRIVE or use heavy machinery until tomorrow (because of the sedation medicines used during the test).    FOLLOW UP: Our staff will call the number listed on your records 48-72 hours following your procedure to check on you and address any questions or concerns that you may have regarding the information given to you following your procedure. If we do not reach you, we will leave a message.  We will attempt to reach you two times.  During this call, we will ask if you have developed any symptoms of COVID 19. If you develop any symptoms (ie: fever, flu-like symptoms, shortness of breath, cough etc.) before then, please call [phone].  If you test positive for Covid 19 in the 2 weeks post procedure, please call and report this information to [place].    If any biopsies were taken you will be contacted by phone or by letter within the next 1-3 weeks.  Please call us at [phone] if you have not heard about  the biopsies in 3 weeks.    SIGNATURES/CONFIDENTIALITY: You and/or your care partner have signed paperwork which will be entered into your electronic medical record.  These signatures attest to the fact that that the information above on your After Visit Summary has been reviewed and is understood.  Full responsibility of the confidentiality of this discharge information lies with you and/or your care-partner.

## 2020-02-29 ENCOUNTER — Telehealth: Payer: Self-pay

## 2020-02-29 NOTE — Telephone Encounter (Signed)
LVM

## 2020-03-06 ENCOUNTER — Encounter: Payer: Self-pay | Admitting: Gastroenterology

## 2020-03-09 ENCOUNTER — Encounter: Payer: Self-pay | Admitting: Family Medicine

## 2020-03-09 ENCOUNTER — Ambulatory Visit: Payer: Managed Care, Other (non HMO) | Admitting: Family Medicine

## 2020-03-09 ENCOUNTER — Other Ambulatory Visit: Payer: Self-pay

## 2020-03-09 DIAGNOSIS — H00024 Hordeolum internum left upper eyelid: Secondary | ICD-10-CM | POA: Diagnosis not present

## 2020-03-09 MED ORDER — ERYTHROMYCIN 5 MG/GM OP OINT: 1.0000 "application " | TOPICAL_OINTMENT | Freq: Every day | OPHTHALMIC | 0 refills | Status: DC

## 2020-03-09 NOTE — Patient Instructions (Signed)
If you notice increased redness or swelling of eyelid or any eye symptoms or vision change let us know  Use the erythromycin ointment as directed and use warm compresses   Keep skin and hands clean also

## 2020-03-09 NOTE — Assessment & Plan Note (Signed)
Has had this before  Mild upper lid swelling w/o significant redness tx with erythromycin ointment and adv to use warm compresses Watch for eye pain/vision change/photophobia or increased swelling or redness of skin  Update if not starting to improve in a week or if worsening

## 2020-03-09 NOTE — Progress Notes (Signed)
Subjective:    Patient ID: Phillip Evans, male    DOB: 1970-07-30, 50 y.o.   MRN: [id number]  This visit occurred during the SARS-CoV-2 public health emergency.  Safety protocols were in place, including screening questions prior to the visit, additional usage of staff PPE, and extensive cleaning of exam room while observing appropriate contact time as indicated for disinfecting solutions.    HPI Pt presents for swollen left eyelid   Wt Readings from Last 3 Encounters:  03/09/20 245 lb (111.1 kg)  02/27/20 239 lb (108.4 kg)  01/03/20 239 lb (108.4 kg)   36.18 kg/m   This happened before -last year  Has had styes in the past   No fb No trauma to eye  Thought he saw a white pimple under eyelid   No fever Has not used warm comp  No eyedrops  No contact lenses   No uri symptoms   Eye itself is not a problem  No pain or photophobia  No pain on moving eye  No redness   Patient Active Problem List   Diagnosis Date Noted  . COVID-19 virus infection 10/21/2019  . GERD (gastroesophageal reflux disease) 07/05/2019  . Preseptal cellulitis of right upper eyelid 06/17/2018  . Hordeolum internum left upper eyelid 05/06/2018  . Sore throat 05/06/2018  . Right tennis elbow 12/28/2017  . Neck pain on left side 12/29/2016  . B12 deficiency 09/27/2015  . Right leg injury 05/19/2012  . Foot swelling 05/13/2012  . Obesity, Class II, BMI 35-39.9, no comorbidity ([place]) 06/11/2010  . Fatigue 06/11/2010   Past Medical History:  Diagnosis Date  . Allergy   . GERD (gastroesophageal reflux disease)   . Heart murmur   . Lactose intolerance   . Obesity    Past Surgical History:  Procedure Laterality Date  . ESOPHAGOGASTRODUODENOSCOPY  2000   WNL per pt  . R hand surgery  1994   with tendon reconstruction  . TONSILLECTOMY    . UPPER GASTROINTESTINAL ENDOSCOPY    . WISDOM TOOTH EXTRACTION     x1   Social History   Tobacco Use  . Smoking status: Never Smoker  . Smokeless  tobacco: Never Used  Vaping Use  . Vaping Use: Never used  Substance Use Topics  . Alcohol use: No    Alcohol/week: 0.0 standard drinks  . Drug use: No   Family History  Problem Relation Age of Onset  . Diabetes Father   . Cancer Maternal Grandmother        stomach cancer  . Stomach cancer Maternal Grandmother   . Coronary artery disease Maternal Grandfather 70       CAD/MI  . Cancer Maternal Grandfather        unsure which  . Hypertension Maternal Grandfather   . ALS Maternal Grandfather   . Hypertension Paternal Grandfather   . Colon cancer Neg Hx   . Esophageal cancer Neg Hx   . Rectal cancer Neg Hx   . Pancreatic cancer Neg Hx    Allergies  Allergen Reactions  . Elemental Sulfur Hives  . Oxycodone Nausea Only  . Tetanus Toxoids Other (See Comments)    Swollen, red arm   Current Outpatient Medications on File Prior to Visit  Medication Sig Dispense Refill  . famotidine (PEPCID) 40 MG tablet Take 1 tablet (40 mg total) by mouth at bedtime. 30 tablet 11  . omeprazole (PRILOSEC) 40 MG capsule Take 40 mg by mouth daily.     No  current facility-administered medications on file prior to visit.     Review of Systems  Constitutional: Negative for activity change, appetite change, fatigue, fever and unexpected weight change.  HENT: Negative for congestion, rhinorrhea, sore throat and trouble swallowing.   Eyes: Negative for photophobia, pain, discharge, redness, itching and visual disturbance.       Eyelid swelling and redness    Respiratory: Negative for cough, chest tightness, shortness of breath and wheezing.   Cardiovascular: Negative for chest pain and palpitations.  Gastrointestinal: Negative for abdominal pain, blood in stool, constipation, diarrhea and nausea.  Endocrine: Negative for cold intolerance, heat intolerance, polydipsia and polyuria.  Genitourinary: Negative for difficulty urinating, dysuria, frequency and urgency.  Musculoskeletal: Negative for  arthralgias, joint swelling and myalgias.  Skin: Negative for pallor and rash.  Neurological: Negative for dizziness, tremors, weakness, numbness and headaches.  Hematological: Negative for adenopathy. Does not bruise/bleed easily.  Psychiatric/Behavioral: Negative for decreased concentration and dysphoric mood. The patient is not nervous/anxious.        Objective:   Physical Exam Constitutional:      General: He is not in acute distress.    Appearance: Normal appearance. He is obese. He is not ill-appearing.  HENT:     Head: Normocephalic and atraumatic.     Comments: No facial swelling    Mouth/Throat:     Mouth: Mucous membranes are moist.  Eyes:     General: No scleral icterus.       Right eye: No discharge.        Left eye: No discharge.     Extraocular Movements: Extraocular movements intact.     Conjunctiva/sclera: Conjunctivae normal.     Pupils: Pupils are equal, round, and reactive to light.     Comments: Mild L upper eyelid swelling  Small (57mm) lump under upper lid w/o drainage  No tenderness  Nl appearing eye and sclera Grossly nl vision  No changes R eye  Cardiovascular:     Rate and Rhythm: Normal rate and regular rhythm.     Heart sounds: Normal heart sounds.  Skin:    General: Skin is warm and dry.     Findings: No rash.  Neurological:     Mental Status: He is alert.     Cranial Nerves: No cranial nerve deficit.  Psychiatric:        Mood and Affect: Mood normal.           Assessment & Plan:   Problem List Items Addressed This Visit      Musculoskeletal and Integument   Hordeolum internum left upper eyelid    Has had this before  Mild upper lid swelling w/o significant redness tx with erythromycin ointment and adv to use warm compresses Watch for eye pain/vision change/photophobia or increased swelling or redness of skin  Update if not starting to improve in a week or if worsening

## 2020-04-09 ENCOUNTER — Encounter: Payer: Self-pay | Admitting: Internal Medicine

## 2020-04-09 ENCOUNTER — Other Ambulatory Visit: Payer: Self-pay

## 2020-04-09 ENCOUNTER — Ambulatory Visit: Payer: Managed Care, Other (non HMO) | Admitting: Internal Medicine

## 2020-04-09 ENCOUNTER — Ambulatory Visit (INDEPENDENT_AMBULATORY_CARE_PROVIDER_SITE_OTHER)
Admission: RE | Admit: 2020-04-09 | Discharge: 2020-04-09 | Disposition: A | Discharge status: Home / Self Care | Payer: Managed Care, Other (non HMO) | Source: Ambulatory Visit | Attending: Internal Medicine | Admitting: Internal Medicine

## 2020-04-09 VITALS — BP 124/78 | HR 72 | Temp 98.1°F | Wt 249.0 lb

## 2020-04-09 DIAGNOSIS — M79671 Pain in right foot: Secondary | ICD-10-CM

## 2020-04-09 MED ORDER — VALACYCLOVIR HCL 1 G PO TABS: 1000.0000 mg | ORAL_TABLET | Freq: Two times a day (BID) | ORAL | 0 refills | Status: DC

## 2020-04-09 NOTE — Patient Instructions (Signed)

## 2020-04-09 NOTE — Progress Notes (Signed)
Subjective:    Patient ID: Phillip Evans, male    DOB: 09/15/1970, 50 y.o.   MRN: [id number]  HPI  Pt presents to the clinic today with c/o foot pain. He reports this started 2.5 weeks ago. He reports hearing a popping noise from the top of his foot when he flexes his foot. He describes the pains as achy. He denies any numbness or tingling in the foot but does have some intermittent numbness and tingling of his right thigh, when he lays on that side. He has tried Aleve OTC as needed. He denies any injury to the area but did going skiing a few days before he started noticing foot pain.   Review of Systems      Past Medical History:  Diagnosis Date  . Allergy   . GERD (gastroesophageal reflux disease)   . Heart murmur   . Lactose intolerance   . Obesity     Current Outpatient Medications  Medication Sig Dispense Refill  . erythromycin ophthalmic ointment Place 1 application into the left eye at bedtime. 3.5 g 0  . famotidine (PEPCID) 40 MG tablet Take 1 tablet (40 mg total) by mouth at bedtime. 30 tablet 11  . omeprazole (PRILOSEC) 40 MG capsule Take 40 mg by mouth daily.     No current facility-administered medications for this visit.    Allergies  Allergen Reactions  . Elemental Sulfur Hives  . Oxycodone Nausea Only  . Tetanus Toxoids Other (See Comments)    Swollen, red arm    Family History  Problem Relation Age of Onset  . Diabetes Father   . Cancer Maternal Grandmother        stomach cancer  . Stomach cancer Maternal Grandmother   . Coronary artery disease Maternal Grandfather 56       CAD/MI  . Cancer Maternal Grandfather        unsure which  . Hypertension Maternal Grandfather   . ALS Maternal Grandfather   . Hypertension Paternal Grandfather   . Colon cancer Neg Hx   . Esophageal cancer Neg Hx   . Rectal cancer Neg Hx   . Pancreatic cancer Neg Hx     Social History   Socioeconomic History  . Marital status: Married    Spouse name: Not on file  .  Number of children: 3  . Years of education: college  . Highest education level: Not on file  Occupational History  . Occupation: Psychologist, occupational: OTHER  Tobacco Use  . Smoking status: Never Smoker  . Smokeless tobacco: Never Used  Vaping Use  . Vaping Use: Never used  Substance and Sexual Activity  . Alcohol use: No    Alcohol/week: 0.0 standard drinks  . Drug use: No  . Sexual activity: Not on file  Other Topics Concern  . 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  Respiratory: Denies difficulty breathing, shortness of breath, cough or sputum production.   Cardiovascular:  Denies chest pain, chest tightness, palpitations or swelling in the hands or feet.  Musculoskeletal: Pt reports foot pain. Denies decrease in range of motion, difficulty with gait, muscle pain or joint swelling.  Skin: Denies redness, rashes, lesions or ulcercations.    No other specific complaints in a complete review of systems (except as listed in HPI above).  Objective:   Physical Exam  BP 124/78   Pulse 72   Temp 98.1 F (36.7 C) (Temporal)   Wt 249 lb (112.9 kg)   SpO2 98%   BMI 36.77 kg/m   Wt Readings from Last 3 Encounters:  03/09/20 245 lb (111.1 kg)  02/27/20 239 lb (108.4 kg)  01/03/20 239 lb (108.4 kg)    General: Appears his stated age, obese, in NAD. Skin: Warm, dry and intact. No rashes, lesions or ulcerations noted. Cardiovascular: Normal rate. Pedal pulses 2+ bilaterally. Pulmonary/Chest:  Normal effort. Musculoskeletal: Normal flexion, extension and rotation of the right ankle. Popping noise coming from elevated mass overlying the mid 4th metatarsal. No swelling noted. Gait slow and steady without device. Neurological: Alert and oriented.    BMET    Component Value Date/Time   NA 138 07/04/2019 0941   K 3.9 07/04/2019 0941   CL 103 07/04/2019 0941   CO2 25 07/04/2019 0941   GLUCOSE 117 (H) 07/04/2019 0941   BUN 17 07/04/2019 0941   CREATININE 1.15 07/04/2019 0941   CALCIUM 9.4 07/04/2019 0941   GFRNONAA >60 07/04/2019 0941   GFRAA >60 07/04/2019 0941    Lipid Panel     Component Value Date/Time   CHOL 223 (H) 06/12/2010 1022   TRIG 198.0 (H) 06/12/2010 1022   HDL 43.10 06/12/2010 1022   CHOLHDL 5 06/12/2010 1022   VLDL 39.6 06/12/2010 1022    CBC    Component Value Date/Time   WBC 6.3 07/04/2019 0941   RBC 5.25 07/04/2019 0941   HGB 14.5 07/04/2019 0941   HCT 42.1 07/04/2019 0941   PLT 234 07/04/2019 0941   MCV 80.2 07/04/2019 0941   MCH 27.6 07/04/2019 0941   MCHC 34.4 07/04/2019 0941   RDW 13.0 07/04/2019 0941   LYMPHSABS 1.9 12/29/2016 0852   MONOABS 0.4 12/29/2016 0852   EOSABS 0.1 12/29/2016 0852   BASOSABS 0.0 12/29/2016 0852    Hgb A1C Lab Results  Component Value Date   HGBA1C 5.4 08/28/2015           Assessment & Plan:   Right Foot Pain:  Xray right foot today No intervention at this time until xray reviewed Encouraged elevation, Aleve and ice as needed  Will follow up after xray, return precautions discussed  Webb Silversmith, NP This visit occurred during the SARS-CoV-2 public health emergency.  Safety protocols were in place, including screening questions prior to the visit, additional usage of staff PPE, and extensive cleaning of exam room while observing appropriate contact time as indicated for disinfecting solutions.

## 2020-04-18 ENCOUNTER — Encounter: Payer: Self-pay | Admitting: Family Medicine

## 2020-04-18 ENCOUNTER — Ambulatory Visit: Payer: Managed Care, Other (non HMO) | Admitting: Family Medicine

## 2020-04-18 ENCOUNTER — Other Ambulatory Visit: Payer: Self-pay

## 2020-04-18 DIAGNOSIS — M79671 Pain in right foot: Secondary | ICD-10-CM | POA: Diagnosis not present

## 2020-04-18 NOTE — Patient Instructions (Addendum)
Possible bone cyst causing irritation of tendon.  Treat with voltaren gel to affected area up to 3 times a day as well as use supportive shoe wear, avoid firm shoes, thin insoles.  Consider midfoot brace for extra support. If not improving with this, let us know for referral to foot doctor.

## 2020-04-18 NOTE — Assessment & Plan Note (Signed)
Pain over tender hard lump on right dorsolateral foot ?bone cyst with resultant irritation of ?peroneus tertius tendon. rec voltaren gel, supportive shoe wear, discussed trial midfoot brace use. Update if not improving for referral to foot doctor. Pt agrees with plan.

## 2020-04-18 NOTE — Progress Notes (Signed)
Patient ID: Phillip Evans, male    DOB: 15-Nov-1970, 50 y.o.   MRN: [id number]  This visit was conducted in person.  BP 130/82 (BP Location: Right Arm, Patient Position: Sitting, Cuff Size: Large)   Pulse 64   Temp (!) 97.3 F (36.3 C) (Temporal)   Ht 5\' 9"  (1.753 m)   Wt 246 lb (111.6 kg)   SpO2 97%   BMI 36.33 kg/m    CC: R foot pain, ongoing Subjective:   HPI: Phillip Evans is a 50 y.o. male presenting on 04/18/2020 for Foot Pain   See prior note for details.  Saw Webb Silversmith last week for same, foot xray at that time reassuring. Treated with PRN advil. Pain worsening.   Foot pain started late 02/2020, no inciting injury/trauma. He had recently gone skiing prior to development of pain. He notes R foot pops at midfoot when he dorsiflexes foot.   Pain seems to be progressively worsening and traveling up leg - worse pain when wearing dress shoes or thin soled shoes.  Notes lateral shoes wear out more quickly.   GERD symptoms have improved. He had reassuring EGD and colonoscopy.      Relevant past medical, surgical, family and social history reviewed and updated as indicated. Interim medical history since our last visit reviewed. Allergies and medications reviewed and updated. Outpatient Medications Prior to Visit  Medication Sig Dispense Refill  . erythromycin ophthalmic ointment Place 1 application into the left eye at bedtime. 3.5 g 0  . esomeprazole (NEXIUM) 20 MG capsule Take 20 mg by mouth daily at 12 noon.    . famotidine (PEPCID) 40 MG tablet Take 1 tablet (40 mg total) by mouth at bedtime. 30 tablet 11  . omeprazole (PRILOSEC) 40 MG capsule Take 40 mg by mouth daily.     No facility-administered medications prior to visit.     Per HPI unless specifically indicated in ROS section below Review of Systems Objective:  BP 130/82 (BP Location: Right Arm, Patient Position: Sitting, Cuff Size: Large)   Pulse 64   Temp (!) 97.3 F (36.3 C) (Temporal)   Ht 5\' 9"   (1.753 m)   Wt 246 lb (111.6 kg)   SpO2 97%   BMI 36.33 kg/m   Wt Readings from Last 3 Encounters:  04/18/20 246 lb (111.6 kg)  04/09/20 249 lb (112.9 kg)  03/09/20 245 lb (111.1 kg)      Physical Exam Vitals and nursing note reviewed.  Constitutional:      Appearance: Normal appearance. He is not ill-appearing.  Musculoskeletal:        General: Swelling and tenderness present. No deformity or signs of injury. Normal range of motion.     Right lower leg: No edema.     Left lower leg: No edema.     Comments:  2+ DP bilaterally  No ligament laxity, no significant pain with palpation of peroneal tendon, no pain at malleoli, no pain at achilles, neg calc squeeze, no pain at base of 5th MT Hard tender bump to R dorsolateral midfoot, tendon subluxes over bump with dorsiflexion of toes  Skin:    General: Skin is warm and dry.     Findings: No erythema or rash.  Neurological:     Mental Status: He is alert.  Psychiatric:        Mood and Affect: Mood normal.        Behavior: Behavior normal.        DG Foot Complete  Right CLINICAL DATA:  Right foot pain.  EXAM: RIGHT FOOT COMPLETE - 3+ VIEW  COMPARISON:  None.  FINDINGS: There is no evidence of fracture or dislocation. There is no evidence of arthropathy or other focal bone abnormality. Soft tissues are unremarkable.  IMPRESSION: Negative.  Electronically Signed   By: Virgina Norfolk M.D.   On: 04/10/2020 16:42   Assessment & Plan:  This visit occurred during the SARS-CoV-2 public health emergency.  Safety protocols were in place, including screening questions prior to the visit, additional usage of staff PPE, and extensive cleaning of exam room while observing appropriate contact time as indicated for disinfecting solutions.   Problem List Items Addressed This Visit    Acute foot pain, right    Pain over tender hard lump on right dorsolateral foot ?bone cyst with resultant irritation of ?peroneus tertius tendon.  rec voltaren gel, supportive shoe wear, discussed trial midfoot brace use. Update if not improving for referral to foot doctor. Pt agrees with plan.           No orders of the defined types were placed in this encounter.  No orders of the defined types were placed in this encounter.   Patient Instructions  Possible bone cyst causing irritation of tendon.  Treat with voltaren gel to affected area up to 3 times a day as well as use supportive shoe wear, avoid firm shoes, thin insoles.  Consider midfoot brace for extra support. If not improving with this, let us know for referral to foot doctor.    Follow up plan: No follow-ups on file.  Ria Bush, MD

## 2020-09-14 ENCOUNTER — Ambulatory Visit: Payer: Managed Care, Other (non HMO) | Admitting: Family Medicine

## 2020-09-21 ENCOUNTER — Encounter: Payer: Self-pay | Admitting: Family Medicine

## 2020-09-21 ENCOUNTER — Other Ambulatory Visit: Payer: Self-pay

## 2020-09-21 ENCOUNTER — Ambulatory Visit: Payer: No Typology Code available for payment source | Admitting: Family Medicine

## 2020-09-21 DIAGNOSIS — E538 Deficiency of other specified B group vitamins: Secondary | ICD-10-CM

## 2020-09-21 DIAGNOSIS — R202 Paresthesia of skin: Secondary | ICD-10-CM

## 2020-09-21 MED ORDER — B COMPLEX VITAMINS PO CAPS: 1.0000 | ORAL_CAPSULE | Freq: Every day | ORAL | Status: DC

## 2020-09-21 NOTE — Assessment & Plan Note (Signed)
Bilateral hand paresthesias suspect L>R carpal tunnel however some atypical presentation. Limit NSAID use due to h/o GERD. Recommend nocturnal wrist brace use to left wrist for 2+ wks to see effect on symptoms.  In nexium use, did recommend start daily B complex vitamin for 1 month then reassess symptoms.  Discussed next step likely hand surgery referral if needed. Pt agrees with plan.

## 2020-09-21 NOTE — Progress Notes (Signed)
Patient ID: Phillip Evans, male    DOB: 05/31/1970, 50 y.o.   MRN: [id number]  This visit was conducted in person.  BP 122/80   Pulse 69   Temp 98.4 F (36.9 C) (Temporal)   Ht '5\' 9"'$  (1.753 m)   Wt 240 lb (108.9 kg)   SpO2 97%   BMI 35.44 kg/m    CC: bilateral hand numbness  Subjective:   HPI: Phillip Evans is a 50 y.o. male presenting on 09/21/2020 for Carpal Tunnel (Bilateral hand numbness/)   1 mo h/o bilateral hand numbness/paresthesias worse when he wakes up from sleep. Describes numbness to bilateral 2nd/3rd/ finger tips. Entire L>R hand falls asleep when he wakes up - associated with swelling. A few weeks ago was having hand pain associated with these symptoms. Denies inciting trauma/injury or falls. No shooting pain down arms. Some bilateral lateral neck pain.  Hasn't tried anything for these symptoms besides ibuprofen/tylenol.  Wants to avoid NSAIDs due to GI history.  No paresthesias to feet or rest of body.  No significant alcohol intake.   H/o R hand surgery 1994 with tendon reconstruction. Chronic paresthesias/numbness to R 4th/5th digits since then.   GERD has significant improved - was using dexilant '60mg'$  but now well controlled on daily low dose nexium.   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 Interim medical history since our last visit reviewed. Allergies and medications reviewed and updated. Outpatient Medications Prior to Visit  Medication Sig Dispense Refill   esomeprazole (NEXIUM) 20 MG capsule Take 20 mg by mouth daily at 12 noon.     erythromycin ophthalmic ointment Place 1 application into the left eye at bedtime. 3.5 g 0   No facility-administered medications prior to visit.     Per HPI unless specifically indicated in ROS section below Review of Systems  Objective:  BP 122/80   Pulse 69   Temp 98.4 F  (36.9 C) (Temporal)   Ht '5\' 9"'$  (1.753 m)   Wt 240 lb (108.9 kg)   SpO2 97%   BMI 35.44 kg/m   Wt Readings from Last 3 Encounters:  09/21/20 240 lb (108.9 kg)  04/18/20 246 lb (111.6 kg)  04/09/20 249 lb (112.9 kg)      Physical Exam Vitals and nursing note reviewed.  Constitutional:      Appearance: Normal appearance. He is not ill-appearing.  Neck:     Comments: No midline cervical spine tenderness, FROM at cervical spine, no trapezius mm tenderness Musculoskeletal:        General: No swelling or tenderness. Normal range of motion.     Cervical back: Normal range of motion and neck supple. No rigidity.     Comments:  FROM at wrists and elbows 2+ radial pulses  Lymphadenopathy:     Cervical: No cervical adenopathy.  Skin:    General: Skin is warm and dry.     Findings: No rash.     Comments: Scar to R palm  Neurological:     Mental Status: He is alert.     Sensory: Sensation is intact.     Motor: Motor function is intact.     Coordination: Coordination is intact.     Comments:  5/5 strength BUE Grip strength intact Neg tinel, phalen Sensation intact BUE      Results for orders placed or performed in  visit on 12/07/19  H. pylori antibody, IgG  Result Value Ref Range   H Pylori IgG Negative Negative   Lab Results  Component Value Date   VITAMINB12 198 (L) 12/29/2016   Assessment & Plan:  This visit occurred during the SARS-CoV-2 public health emergency.  Safety protocols were in place, including screening questions prior to the visit, additional usage of staff PPE, and extensive cleaning of exam room while observing appropriate contact time as indicated for disinfecting solutions.   Problem List Items Addressed This Visit     Paresthesia of hand, bilateral    Bilateral hand paresthesias suspect L>R carpal tunnel however some atypical presentation. Limit NSAID use due to h/o GERD. Recommend nocturnal wrist brace use to left wrist for 2+ wks to see effect on  symptoms.  In nexium use, did recommend start daily B complex vitamin for 1 month then reassess symptoms.  Discussed next step likely hand surgery referral if needed. Pt agrees with plan.       B12 deficiency    Has history of frank b12 deficiency, not currently replacing - discussed nexium use contributing.  rec start b complex for 1 month then reassess symptom control.         Meds ordered this encounter  Medications   b complex vitamins capsule    Sig: Take 1 capsule by mouth daily.    No orders of the defined types were placed in this encounter.   Patient instructions: I am suspicious for carpal tunnel syndrome component especially on the left side.  Also possible vitamin deficiency from regular nexium use.  Start using wrist brace to left wrist nightly  Start B complex vitamin for a month to see if any benefit. If no better, let us know and we will refer you to hand surgeon for further evaluation.   Follow up plan: Return if symptoms worsen or fail to improve.  Ria Bush, MD

## 2020-09-21 NOTE — Assessment & Plan Note (Addendum)
Has history of frank b12 deficiency, not currently replacing - discussed nexium use contributing.  rec start b complex for 1 month then reassess symptom control.

## 2020-09-21 NOTE — Patient Instructions (Signed)
I am suspicious for carpal tunnel syndrome component especially on the left side.  Also possible vitamin deficiency from regular nexium use.  Start using wrist brace to left wrist nightly  Start B complex vitamin for a month to see if any benefit. If no better, let us know and we will refer you to hand surgeon for further evaluation.   Carpal Tunnel Syndrome  Carpal tunnel syndrome is a condition that causes pain, numbness, and weakness in your hand and fingers. The carpal tunnel is a narrow area located on the palm side of your wrist. Repeated wrist motion or certain diseases may cause swelling within the tunnel. This swelling pinches the main nerve in the wrist.The main nerve in the wrist is called the median nerve. What are the causes? This condition may be caused by: Repeated and forceful wrist and hand motions. Wrist injuries. Arthritis. A cyst or tumor in the carpal tunnel. Fluid buildup during pregnancy. Use of tools that vibrate. Sometimes the cause of this condition is not known. What increases the risk? The following factors may make you more likely to develop this condition: Having a job that requires you to repeatedly or forcefully move your wrist or hand or requires you to use tools that vibrate. This may include jobs that involve using computers, working on an [employer], or working with [place] such as Pension scheme manager. Being a woman. Having certain conditions, such as: Diabetes. Obesity. An underactive thyroid (hypothyroidism). Kidney failure. Rheumatoid arthritis. What are the signs or symptoms? Symptoms of this condition include: A tingling feeling in your fingers, especially in your thumb, index, and middle fingers. Tingling or numbness in your hand. An aching feeling in your entire arm, especially when your wrist and elbow are bent for a long time. Wrist pain that goes up your arm to your shoulder. Pain that goes down into your palm or fingers. A weak  feeling in your hands. You may have trouble grabbing and holding items. Your symptoms may feel worse during the night. How is this diagnosed? This condition is diagnosed with a medical history and physical exam. You may also have tests, including: Electromyogram (EMG). This test measures electrical signals sent by your nerves into the muscles. Nerve conduction study. This test measures how well electrical signals pass through your nerves. Imaging tests, such as X-rays, ultrasound, and MRI. These tests check for possible causes of your condition. How is this treated? This condition may be treated with: Lifestyle changes. It is important to stop or change the activity that caused your condition. Doing exercise and activities to strengthen and stretch your muscles and tendons (physical therapy). Making lifestyle changes to help with your condition and learning how to do your daily activities safely (occupational therapy). Medicines for pain and inflammation. This may include medicine that is injected into your wrist. A wrist splint or brace. Surgery. Follow these instructions at home: If you have a splint or brace: Wear the splint or brace as told by your health care provider. Remove it only as told by your health care provider. Loosen the splint or brace if your fingers tingle, become numb, or turn cold and blue. Keep the splint or brace clean. If the splint or brace is not waterproof: Do not let it get wet. Cover it with a watertight covering when you take a bath or shower. Managing pain, stiffness, and swelling If directed, put ice on the painful area. To do this: If you have a removeable splint or brace,  remove it as told by your health care provider. Put ice in a plastic bag. Place a towel between your skin and the bag or between the splint or brace and the bag. Leave the ice on for 20 minutes, 2-3 times a day. Do not fall asleep with the cold pack on your skin. Remove the ice if your  skin turns bright red. This is very important. If you cannot feel pain, heat, or cold, you have a greater risk of damage to the area. Move your fingers often to reduce stiffness and swelling. General instructions Take over-the-counter and prescription medicines only as told by your health care provider. Rest your wrist and hand from any activity that may be causing your pain. If your condition is work related, talk with your employer about changes that can be made, such as getting a wrist pad to use while typing. Do any exercises as told by your health care provider, physical therapist, or occupational therapist. Keep all follow-up visits. This is important. Contact a health care provider if: You have new symptoms. Your pain is not controlled with medicines. Your symptoms get worse. Get help right away if: You have severe numbness or tingling in your wrist or hand. Summary Carpal tunnel syndrome is a condition that causes pain, numbness, and weakness in your hand and fingers. It is usually caused by repeated wrist motions. Lifestyle changes and medicines are used to treat carpal tunnel syndrome. Surgery may be recommended. Follow your health care provider's instructions about wearing a splint, resting from activity, keeping follow-up visits, and calling for help. This information is not intended to replace advice given to you by your health care provider. Make sure you discuss any questions you have with your healthcare provider. Document Revised: 05/26/2019 Document Reviewed: 05/26/2019 Elsevier Patient Education  [place].

## 2020-09-26 ENCOUNTER — Telehealth: Payer: Self-pay | Admitting: Family Medicine

## 2020-09-26 ENCOUNTER — Encounter: Payer: Managed Care, Other (non HMO) | Admitting: Family Medicine

## 2020-09-26 NOTE — Telephone Encounter (Signed)
Ok to cancel. Thanks

## 2020-09-26 NOTE — Telephone Encounter (Signed)
Pt called to cancel his 9:30 appt at 8:39. Pt stated that he tried to call yesterday to cancel and was on hold for an hour and a half. He just said that something came up and he wouldn't make the appt today  Would you like me to charge a NO Show or just cancel

## 2020-10-29 ENCOUNTER — Telehealth: Payer: Self-pay | Admitting: Family Medicine

## 2020-10-29 DIAGNOSIS — E538 Deficiency of other specified B group vitamins: Secondary | ICD-10-CM

## 2020-10-29 DIAGNOSIS — R202 Paresthesia of skin: Secondary | ICD-10-CM

## 2020-10-29 NOTE — Telephone Encounter (Signed)
He should have been taking B complex vitamin for the past few months. I recommend first step of labwork prior to referral to hand surgeon - want to ensure vitamin B12 levels are better on daily oral replacement - if staying high, will need B12 shots to supplement - and would want to see if hand numbness improves once B12 levels are normal.  I have ordered labs, he may go where for this, hopefully in the next week.

## 2020-10-29 NOTE — Telephone Encounter (Signed)
Pt called in requesting a call back about ongoing left hand pain would like to get a referral #

## 2020-10-30 NOTE — Telephone Encounter (Signed)
Spoke with Phillip Evans relaying Dr. Synthia Innocent message.  Phillip Evans verbalizes understanding and scheduled lab visit on 11/07/20 at 9:30.

## 2020-11-07 ENCOUNTER — Other Ambulatory Visit: Payer: Self-pay

## 2020-11-07 ENCOUNTER — Other Ambulatory Visit (INDEPENDENT_AMBULATORY_CARE_PROVIDER_SITE_OTHER): Payer: No Typology Code available for payment source

## 2020-11-07 DIAGNOSIS — R202 Paresthesia of skin: Secondary | ICD-10-CM

## 2020-11-07 DIAGNOSIS — E538 Deficiency of other specified B group vitamins: Secondary | ICD-10-CM

## 2020-11-07 LAB — CBC WITH DIFFERENTIAL/PLATELET
Basophils Absolute: 0 10*3/uL (ref 0.0–0.1)
Basophils Relative: 0.3 % (ref 0.0–3.0)
Eosinophils Absolute: 0.1 10*3/uL (ref 0.0–0.7)
Eosinophils Relative: 1.3 % (ref 0.0–5.0)
HCT: 40.2 % (ref 39.0–52.0)
Hemoglobin: 13.9 g/dL (ref 13.0–17.0)
Lymphocytes Relative: 33.4 % (ref 12.0–46.0)
Lymphs Abs: 1.9 10*3/uL (ref 0.7–4.0)
MCHC: 34.5 g/dL (ref 30.0–36.0)
MCV: 82.9 fl (ref 78.0–100.0)
Monocytes Absolute: 0.3 10*3/uL (ref 0.1–1.0)
Monocytes Relative: 4.9 % (ref 3.0–12.0)
Neutro Abs: 3.4 10*3/uL (ref 1.4–7.7)
Neutrophils Relative %: 60.1 % (ref 43.0–77.0)
Platelets: 217 10*3/uL (ref 150.0–400.0)
RBC: 4.85 Mil/uL (ref 4.22–5.81)
RDW: 13.3 % (ref 11.5–15.5)
WBC: 5.6 10*3/uL (ref 4.0–10.5)

## 2020-11-07 LAB — BASIC METABOLIC PANEL
BUN: 15 mg/dL (ref 6–23)
CO2: 26 mEq/L (ref 19–32)
Calcium: 9.4 mg/dL (ref 8.4–10.5)
Chloride: 104 mEq/L (ref 96–112)
Creatinine, Ser: 0.91 mg/dL (ref 0.40–1.50)
GFR: 100.13 mL/min (ref >60.00)
Glucose, Bld: 146 mg/dL — ABNORMAL HIGH (ref 70–99)
Potassium: 3.9 mEq/L (ref 3.5–5.1)
Sodium: 138 mEq/L (ref 135–145)

## 2020-11-07 LAB — VITAMIN B12: Vitamin B-12: 169 pg/mL — ABNORMAL LOW (ref 211–911)

## 2020-11-07 LAB — TSH: TSH: 2.53 u[IU]/mL (ref 0.35–5.50)

## 2020-11-12 ENCOUNTER — Telehealth: Payer: Self-pay

## 2020-11-12 NOTE — Telephone Encounter (Signed)
Attempted to contact pt.  Vm box is full.  Need to schedule vitamin B12 shots weekly for 4 weeks, then monthly for 6 months.  (See Labs, Result Notes, 11/07/20)

## 2020-11-13 NOTE — Telephone Encounter (Signed)
Attempted to contact pt.  Vm box is full.  Need to schedule vitamin B12 shots weekly for 4 weeks, then monthly for 6 months.  (See Labs, Result Notes, 11/07/20)

## 2020-11-14 NOTE — Telephone Encounter (Signed)
Attempted to contact pt.  Vm box is full.  Need to schedule vitamin B12 shots weekly for 4 weeks, then monthly for 6 months.  (See Labs, Result Notes, 11/07/20)  Mailing a letter.

## 2020-11-27 ENCOUNTER — Encounter: Payer: No Typology Code available for payment source | Admitting: Family Medicine

## 2021-01-09 ENCOUNTER — Encounter: Payer: Self-pay | Admitting: Family Medicine

## 2021-01-09 ENCOUNTER — Other Ambulatory Visit: Payer: Self-pay

## 2021-01-09 ENCOUNTER — Ambulatory Visit: Payer: No Typology Code available for payment source | Admitting: Family Medicine

## 2021-01-09 VITALS — BP 118/80 | HR 62 | Temp 97.4°F | Ht 69.0 in | Wt 243.0 lb

## 2021-01-09 DIAGNOSIS — M7521 Bicipital tendinitis, right shoulder: Secondary | ICD-10-CM | POA: Diagnosis not present

## 2021-01-09 DIAGNOSIS — M7581 Other shoulder lesions, right shoulder: Secondary | ICD-10-CM | POA: Diagnosis not present

## 2021-01-09 DIAGNOSIS — M25511 Pain in right shoulder: Secondary | ICD-10-CM | POA: Diagnosis not present

## 2021-01-09 MED ORDER — PREDNISONE 20 MG PO TABS: ORAL_TABLET | ORAL | 0 refills | Status: DC

## 2021-01-09 NOTE — Progress Notes (Signed)
Karan Inclan T. Velinda Wrobel, MD, [place] at [facility] [place], [address]  Phone: [phone]   FAX: [phone]  Demond Shallenberger - 50 y.o. male   MRN [id number]   Date of Birth: 1970/05/19  Date: 01/09/2021   PCP: Ria Bush, MD   Referral: Ria Bush, MD  Chief Complaint  Patient presents with   Shoulder Pain    Right    This visit occurred during the SARS-CoV-2 public health emergency.  Safety protocols were in place, including screening questions prior to the visit, additional usage of staff PPE, and extensive cleaning of exam room while observing appropriate contact time as indicated for disinfecting solutions.   Subjective:   This 50 y.o. male patient noted above presents with shoulder pain that has been ongoing for 3 or 4 weeks there is no history of trauma or accident recently The patient denies neck pain or radicular symptoms. Denies dislocation, subluxation, separation of the shoulder. The patient does complain of pain in the overhead plane with significant painful arc of motion.  Pleasant gentleman, who presents with right-sided shoulder pain.  I did see him about 7 years ago with some left-sided impingement.  Hurt at the end of the day.  Ice.  R abduction will hurt a lot.   Taken a lot of NSAIDS.   3 weeks or so. 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   Medications Tried: Tylenol, NSAIDS Ice or Heat: minimally helpful Tried PT: On the left years ago  Prior shoulder Injury: No Prior surgery: No Prior fracture: No   Review of Systems is noted in the HPI, as appropriate  Objective:   Blood pressure 118/80, pulse 62, temperature (!) 97.4 F (36.3 C), temperature source Temporal, height 5\' 9"  (1.753 m), weight 243 lb (110.2 kg), SpO2 96 %.    Shoulder: R Inspection: No muscle wasting or winging Ecchymosis/edema: neg  AC joint, scapula, clavicle: NT Cervical spine: NT,  full ROM Spurling's: neg Abduction: full, 5/5 Flexion: full, 5/5 IR, full, lift-off: 5/5 ER at neutral: full, 5/5 AC crossover: neg Neer: pos Hawkins: pos Drop Test: neg Empty Can: pos Supraspinatus insertion: mild-mod T Bicipital groove: NT Speed's: pos Yergason's: +/- Sulcus sign: neg Scapular dyskinesis: none C5-T1 intact  Neuro: Sensation intact Grip 5/5   Radiology: No results found.  Assessment and Plan:      ICD-10-CM   1. Rotator cuff tendonitis, right  M75.81     2. Acute pain of right shoulder  M25.511     3. Biceps tendonitis, right  M75.21      For him, also [place] give him a pulse of some steroids.    Rotator cuff strengthening and scapular stabilization exercises were reviewed with the patient.  RTC and scapular stabilization and shoulder protocol given to the patient. Retraining shoulder mechanics and function was emphasized to the patient with rehab done ideally 5-6 days a week.    Follow-up: Return in about 5 years (around 01/09/2026) for Dr. Lorelei Pont for R shoulder pain.  Meds ordered this encounter  Medications   predniSONE (DELTASONE) 20 MG tablet    Sig: 2 tabs po daily for 5 days, then 1 tab po daily for 5 days    Dispense:  15 tablet    Refill:  0   No orders of the defined types were placed in this encounter.   Signed,  Maud Deed. Kemari Mares, MD   Patient's Medications  New Prescriptions   PREDNISONE (DELTASONE) 20 MG TABLET    2 tabs po daily for 5 days, then 1 tab po daily for 5 days  Previous Medications   B COMPLEX VITAMINS CAPSULE    Take 1 capsule by mouth daily.   ESOMEPRAZOLE (NEXIUM) 20 MG CAPSULE    Take 20 mg by mouth daily at 12 noon.  Modified Medications   No medications on file  Discontinued Medications   No medications on file

## 2021-01-31 ENCOUNTER — Other Ambulatory Visit: Payer: Self-pay

## 2021-01-31 ENCOUNTER — Ambulatory Visit: Payer: No Typology Code available for payment source | Admitting: Nurse Practitioner

## 2021-01-31 ENCOUNTER — Ambulatory Visit (INDEPENDENT_AMBULATORY_CARE_PROVIDER_SITE_OTHER)
Admission: RE | Admit: 2021-01-31 | Discharge: 2021-01-31 | Disposition: A | Discharge status: Home / Self Care | Payer: No Typology Code available for payment source | Source: Ambulatory Visit | Attending: Nurse Practitioner | Admitting: Nurse Practitioner

## 2021-01-31 VITALS — BP 132/82 | HR 63 | Temp 97.0°F | Resp 14 | Ht 69.0 in | Wt 240.5 lb

## 2021-01-31 DIAGNOSIS — M25522 Pain in left elbow: Secondary | ICD-10-CM | POA: Insufficient documentation

## 2021-01-31 DIAGNOSIS — R739 Hyperglycemia, unspecified: Secondary | ICD-10-CM | POA: Insufficient documentation

## 2021-01-31 LAB — GLUCOSE, POCT (MANUAL RESULT ENTRY): POC Glucose: 84 mg/dl (ref 70–99)

## 2021-01-31 NOTE — Patient Instructions (Signed)
Nice to see you today I will be in touch in regards to the xray results You can get an elbow band and wear it while working to see if it helps with the pain and discomfort Follow up if no improvement or symptoms worsen  Do not take the naproxen and prednisone together as this can be really hard on the stomach

## 2021-01-31 NOTE — Assessment & Plan Note (Signed)
Noticed elevated blood glucose on several metabolic panels in the past.  Patient did state when he takes prednisone he pees more was concerned hyperglycemia patient was fasting today within normal limits.

## 2021-01-31 NOTE — Assessment & Plan Note (Addendum)
Patient here with left elbow pain.  Patient is right-hand dominant.  He does do labor job.  Did recommend patient using a elbow band while working.  Did discuss use of prednisone he has leftover and avoiding it with NSAID use.  Offered Toradol injection in office patient politely declined.  Pending elbow x-ray.  Did discuss if no improvement can always refer to orthopedics.  Patient agrees with plan

## 2021-01-31 NOTE — Progress Notes (Signed)
perglycemi  Acute Office Visit  Subjective:    Patient ID: Phillip Evans, male    DOB: 02/08/1970, 51 y.o.   MRN: [id number]  Chief Complaint  Patient presents with   Elbow Pain    Left, the past 1 to 1 1/2 weeks now, noticed soreness in the elbow. About 2 days ago he was turning a pump and heard a pop in that elbow and after has had decreased strength and decreased sensation.     HPI Patient is in today for Elbow pain  Started approx 1-1.5 weeks ago but approx 2 days ago got worse  States that it started getting sore. States 2 days ago he was putting in a well pump and felt a pop. States that he lost strength in his hand. Sharp stabbing pain. Used ice  and took some left over prednisione 10 mg daily for the past two days, which did seem to help.  History of old injury in the left elbow several years ago and was told he had tennis elbow  Does try to avoid NSIAD because of reflux issues. States that he has taken naproxen yesterday and that helped  Right hand dominant   Past Medical History:  Diagnosis Date   Allergy    GERD (gastroesophageal reflux disease)    Heart murmur    Lactose intolerance    Obesity     Past Surgical History:  Procedure Laterality Date   ESOPHAGOGASTRODUODENOSCOPY  2000   WNL per pt   R hand surgery  1994   with tendon reconstruction   TONSILLECTOMY     UPPER GASTROINTESTINAL ENDOSCOPY     WISDOM TOOTH EXTRACTION     x1    Family History  Problem Relation Age of Onset   Diabetes Father    Cancer Maternal Grandmother        stomach cancer   Stomach cancer Maternal Grandmother    Coronary artery disease Maternal Grandfather 3       CAD/MI   Cancer Maternal Grandfather        unsure which   Hypertension Maternal Grandfather    ALS Maternal Grandfather    Hypertension Paternal Grandfather    Colon cancer Neg Hx    Esophageal cancer Neg Hx    Rectal cancer Neg Hx    Pancreatic cancer Neg Hx     Social History   Socioeconomic  History   Marital status: Married    Spouse name: Not on file   Number of children: 3   Years of education: college   Highest education level: Not on file  Occupational History   Occupation: Psychologist, occupational: OTHER  Tobacco Use   Smoking status: Never   Smokeless tobacco: Never  Vaping Use   Vaping Use: Never used  Substance and Sexual Activity   Alcohol use: No    Alcohol/week: 0.0 standard drinks   Drug use: No   Sexual activity: Not on file  Other Topics Concern   Not on file  Social History Narrative   Caffeine: 4 caffeinated beverages a day   Lives with wife, 3 children, 2 cats   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     esomeprazole (NEXIUM) 20 MG capsule Take 20 mg by mouth daily at 12 noon.     predniSONE (DELTASONE) 20 MG tablet 2 tabs po daily for 5 days, then 1 tab po daily for 5 days 15 tablet 0   No facility-administered medications prior to visit.    Allergies  Allergen Reactions   Elemental Sulfur Hives   Oxycodone Nausea Only   Tetanus Toxoids Other (See Comments)    Swollen, red arm    Review of Systems  Constitutional:  Negative for chills and fever.  Musculoskeletal:  Positive for arthralgias. Negative for joint swelling and neck pain.  Skin:  Negative for color change.  Neurological:  Positive for weakness. Negative for numbness.      Objective:    Physical Exam Vitals and nursing note reviewed.  Constitutional:      Appearance: Normal appearance.  Cardiovascular:     Rate and Rhythm: Normal rate and regular rhythm.      Pulses: Normal pulses.     Heart sounds: Normal heart sounds.  Pulmonary:     Effort: Pulmonary effort is normal.     Breath sounds: Normal breath sounds.  Abdominal:     General: Bowel sounds are normal.  Musculoskeletal:        General: Tenderness present. No swelling, deformity or signs of injury. Normal range of motion.     Comments: Strength 5/5 upper extremity  Pain with rotation of forearm and flexion of the wrist. Worse when weight is applied   Skin:    General: Skin is warm.  Neurological:     General: No focal deficit present.     Mental Status: He is alert.     Sensory: No sensory deficit.     Deep Tendon Reflexes:     Reflex Scores:      Bicep reflexes are 2+ on the right side and 2+ on the left side.    Comments: Sensation intact bilateral upper extremity    BP 132/82    Pulse 63    Temp (!) 97 F (36.1 C)    Resp 14    Ht 5\' 9"  (1.753 m)    Wt 240 lb 8 oz (109.1 kg)    SpO2 98%    BMI 35.52 kg/m  Wt Readings from Last 3 Encounters:  01/31/21 240 lb 8 oz (109.1 kg)  01/09/21 243 lb (110.2 kg)  09/21/20 240 lb (108.9 kg)    Health Maintenance Due  Topic Date Due   HIV Screening  Never done   Hepatitis C Screening  Never done   COVID-19 Vaccine (3 - Booster for Pfizer series) 07/18/2019   TETANUS/TDAP  06/11/2020    There are no preventive care reminders to display for this patient.   Lab Results  Component Value Date   TSH 2.53 11/07/2020   Lab Results  Component Value Date   WBC 5.6 11/07/2020   HGB 13.9 11/07/2020   HCT 40.2 11/07/2020   MCV 82.9 11/07/2020   PLT 217.0 11/07/2020   Lab Results  Component Value Date   NA 138 11/07/2020   K 3.9 11/07/2020   CO2 26 11/07/2020   GLUCOSE 146 (H) 11/07/2020   BUN 15 11/07/2020   CREATININE 0.91 11/07/2020   BILITOT 0.7 07/04/2019   ALKPHOS 74 07/04/2019   AST 21 07/04/2019  ALT 27 07/04/2019   PROT 7.6 07/04/2019   ALBUMIN 4.2 07/04/2019   CALCIUM 9.4 11/07/2020   ANIONGAP 10 07/04/2019    GFR 100.13 11/07/2020   Lab Results  Component Value Date   CHOL 223 (H) 06/12/2010   Lab Results  Component Value Date   HDL 43.10 06/12/2010   No results found for: [facility] Lab Results  Component Value Date   TRIG 198.0 (H) 06/12/2010   Lab Results  Component Value Date   CHOLHDL 5 06/12/2010   Lab Results  Component Value Date   HGBA1C 5.4 08/28/2015       Assessment & Plan:   Problem List Items Addressed This Visit       Other   Hyperglycemia    Noticed elevated blood glucose on several metabolic panels in the past.  Patient did state when he takes prednisone he pees more was concerned hyperglycemia patient was fasting today within normal limits.      Relevant Orders   POCT glucose (manual entry) (Completed)   Left elbow pain - Primary    Patient here with left elbow pain.  Patient is right-hand dominant.  He does do labor job.  Did recommend patient using a elbow band while working.  Did discuss use of prednisone he has leftover and avoiding it with NSAID use.  Offered Toradol injection in office patient politely declined.  Pending elbow x-ray.  Did discuss if no improvement can always refer to orthopedics.  Patient agrees with plan      Relevant Orders   DG Elbow Complete Left     No orders of the defined types were placed in this encounter.  This visit occurred during the SARS-CoV-2 public health emergency.  Safety protocols were in place, including screening questions prior to the visit, additional usage of staff PPE, and extensive cleaning of exam room while observing appropriate contact time as indicated for disinfecting solutions.   Romilda Garret, NP

## 2021-08-13 ENCOUNTER — Ambulatory Visit: Payer: No Typology Code available for payment source | Admitting: Internal Medicine

## 2021-08-13 ENCOUNTER — Encounter: Payer: Self-pay | Admitting: Internal Medicine

## 2021-08-13 DIAGNOSIS — R42 Dizziness and giddiness: Secondary | ICD-10-CM | POA: Diagnosis not present

## 2021-08-13 MED ORDER — MECLIZINE HCL 25 MG PO TABS: 25.0000 mg | ORAL_TABLET | Freq: Three times a day (TID) | ORAL | 1 refills | Status: DC | PRN

## 2021-08-13 NOTE — Patient Instructions (Signed)
How to Perform the Epley Maneuver The Epley maneuver is an exercise that relieves symptoms of vertigo. Vertigo is the feeling that you or your surroundings are moving when they are not. When you feel vertigo, you may feel like the room is spinning and may have trouble walking. The Epley maneuver is used for a type of vertigo caused by a calcium deposit in a part of the inner ear. The maneuver involves changing head positions to help the deposit move out of the area. You can do this maneuver at home whenever you have symptoms of vertigo. You can repeat it in 24 hours if your vertigo has not gone away. Even though the Epley maneuver may relieve your vertigo for a few weeks, it is possible that your symptoms will return. This maneuver relieves vertigo, but it does not relieve dizziness. What are the risks? If it is done correctly, the Epley maneuver is considered safe. Sometimes it can lead to dizziness or nausea that goes away after a short time. If you develop other symptoms--such as changes in vision, weakness, or numbness--stop doing the maneuver and call your health care provider. Supplies needed: A bed or table. A pillow. How to do the Epley maneuver     Sit on the edge of a bed or table with your back straight and your legs extended or hanging over the edge of the bed or table. Turn your head halfway toward the affected ear or side as told by your health care provider. Lie backward quickly with your head turned until you are lying flat on your back. Your head should dangle (head-hanging position). You may want to position a pillow under your shoulders. Hold this position for at least 30 seconds. If you feel dizzy or have symptoms of vertigo, continue to hold the position until the symptoms stop. Turn your head to the opposite direction until your unaffected ear is facing down. Your head should continue to dangle. Hold this position for at least 30 seconds. If you feel dizzy or have symptoms of  vertigo, continue to hold the position until the symptoms stop. Turn your whole body to the same side as your head so that you are positioned on your side. Your head will now be nearly facedown and no longer needs to dangle. Hold for at least 30 seconds. If you feel dizzy or have symptoms of vertigo, continue to hold the position until the symptoms stop. Sit back up. You can repeat the maneuver in 24 hours if your vertigo does not go away. Follow these instructions at home: For 24 hours after doing the Epley maneuver: Keep your head in an upright position. When lying down to sleep or rest, keep your head raised (elevated) with two or more pillows. Avoid excessive neck movements. Activity Do not drive or use machinery if you feel dizzy. After doing the Epley maneuver, return to your normal activities as told by your health care provider. Ask your health care provider what activities are safe for you. General instructions Drink enough fluid to keep your urine pale yellow. Do not drink alcohol. Take over-the-counter and prescription medicines only as told by your health care provider. Keep all follow-up visits. This is important. Preventing vertigo symptoms Ask your health care provider if there is anything you should do at home to prevent vertigo. He or she may recommend that you: Keep your head elevated with two or more pillows while you sleep. Do not sleep on the side of your affected ear. Get   up slowly from bed. Avoid sudden movements during the day. Avoid extreme head positions or movement, such as looking up or bending over. Contact a health care provider if: Your vertigo gets worse. You have other symptoms, including: Nausea. Vomiting. Headache. Get help right away if you: Have vision changes. Have a headache or neck pain that is severe or getting worse. Cannot stop vomiting. Have new numbness or weakness in any part of your body. These symptoms may represent a serious problem  that is an emergency. Do not wait to see if the symptoms will go away. Get medical help right away. Call your local emergency services (911 in the [place] Do not drive yourself to the hospital. Summary Vertigo is the feeling that you or your surroundings are moving when they are not. The Epley maneuver is an exercise that relieves symptoms of vertigo. If the Epley maneuver is done correctly, it is considered safe. This information is not intended to replace advice given to you by your health care provider. Make sure you discuss any questions you have with your health care provider. Document Revised: 12/14/2019 Document Reviewed: 12/14/2019 Elsevier Patient Education  2023 Elsevier Inc.  

## 2021-08-13 NOTE — Assessment & Plan Note (Addendum)
Recurrent Nothing to suggest tumor or vascular cause Discussed watching salt intake Rx meclizine for prn use He has heard about Epley maneuver--will give info If it worsens, will refer to ENT

## 2021-08-13 NOTE — Progress Notes (Signed)
Subjective:    Patient ID: Phillip Evans, male    DOB: Apr 12, 1970, 51 y.o.   MRN: [id number]  HPI Here due to vertigo  Has vertigo for the past year or so No prior problems---other than some seasickness Now will notice when he rolls over in bed Tylenol cold and sinus may help some Will come on for several days---then resolve  First episode was really bad---hard to even stand Wondered about dehydration---trying to hydrate better May have had 4-5 spells in the past year Always worst first thing in the morning--turning over in bed. Then slowly improves  Mild nausea with the symptoms--not close to nausea  Current Outpatient Medications on File Prior to Visit  Medication Sig Dispense Refill   b complex vitamins capsule Take 1 capsule by mouth daily.     esomeprazole (NEXIUM) 20 MG capsule Take 20 mg by mouth daily at 12 noon.     No current facility-administered medications on file prior to visit.    Allergies  Allergen Reactions   Elemental Sulfur Hives   Oxycodone Nausea Only   Tetanus Toxoids Other (See Comments)    Swollen, red arm    Past Medical History:  Diagnosis Date   Allergy    GERD (gastroesophageal reflux disease)    Heart murmur    Lactose intolerance    Obesity     Past Surgical History:  Procedure Laterality Date   ESOPHAGOGASTRODUODENOSCOPY  2000   WNL per pt   R hand surgery  1994   with tendon reconstruction   TONSILLECTOMY     UPPER GASTROINTESTINAL ENDOSCOPY     WISDOM TOOTH EXTRACTION     x1    Family History  Problem Relation Age of Onset   Diabetes Father    Cancer Maternal Grandmother        stomach cancer   Stomach cancer Maternal Grandmother    Coronary artery disease Maternal Grandfather 10       CAD/MI   Cancer Maternal Grandfather        unsure which   Hypertension Maternal Grandfather    ALS Maternal Grandfather    Hypertension Paternal Grandfather    Colon cancer Neg Hx    Esophageal cancer Neg Hx    Rectal cancer  Neg Hx    Pancreatic cancer Neg Hx     Social History   Socioeconomic History   Marital status: Married    Spouse name: Not on file   Number of children: 3   Years of education: college   Highest education level: Not on file  Occupational History   Occupation: Psychologist, occupational: OTHER  Tobacco Use   Smoking status: Never    Passive exposure: Past   Smokeless tobacco: Never  Vaping Use   Vaping Use: Never used  Substance and Sexual Activity   Alcohol use: No    Alcohol/week: 0.0 standard drinks of alcohol   Drug use: No   Sexual activity: Not on file  Other Topics Concern   Not on file  Social History Narrative   Caffeine: 4 caffeinated beverages a day   Lives with wife, 3 children, 2 cats   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  HENT:     Ears:     Comments: Some cerumen Lateral scarring on left--right not seen as well Eyes:     Extraocular Movements: Extraocular movements intact.     Pupils: Pupils are equal, round, and reactive to light.     Comments: ?slight horizontal nystagmus with right lateral gaze  Musculoskeletal:     Cervical back: Neck supple.  Lymphadenopathy:     Cervical: No cervical adenopathy.  Neurological:     Mental Status: He is alert and oriented to person, place, and time.     Cranial Nerves: Cranial nerves 2-12 are intact.      Motor: No weakness, tremor or abnormal muscle tone.     Coordination: Coordination is intact. Romberg sign negative. Coordination normal.     Gait: Gait normal.            Assessment & Plan:

## 2021-09-14 ENCOUNTER — Other Ambulatory Visit: Payer: Self-pay | Admitting: Family Medicine

## 2021-09-14 DIAGNOSIS — Z1159 Encounter for screening for other viral diseases: Secondary | ICD-10-CM

## 2021-09-14 DIAGNOSIS — R739 Hyperglycemia, unspecified: Secondary | ICD-10-CM

## 2021-09-14 DIAGNOSIS — E538 Deficiency of other specified B group vitamins: Secondary | ICD-10-CM

## 2021-09-14 DIAGNOSIS — E559 Vitamin D deficiency, unspecified: Secondary | ICD-10-CM | POA: Insufficient documentation

## 2021-09-14 NOTE — Addendum Note (Signed)
Addended by: Ria Bush on: 09/14/2021 11:03 AM   Modules accepted: Orders

## 2021-09-20 ENCOUNTER — Other Ambulatory Visit (INDEPENDENT_AMBULATORY_CARE_PROVIDER_SITE_OTHER): Payer: No Typology Code available for payment source

## 2021-09-20 DIAGNOSIS — E559 Vitamin D deficiency, unspecified: Secondary | ICD-10-CM | POA: Diagnosis not present

## 2021-09-20 DIAGNOSIS — R739 Hyperglycemia, unspecified: Secondary | ICD-10-CM

## 2021-09-20 DIAGNOSIS — E538 Deficiency of other specified B group vitamins: Secondary | ICD-10-CM

## 2021-09-20 DIAGNOSIS — Z1159 Encounter for screening for other viral diseases: Secondary | ICD-10-CM

## 2021-09-20 LAB — COMPREHENSIVE METABOLIC PANEL
ALT: 19 U/L (ref 0–53)
AST: 14 U/L (ref 0–37)
Albumin: 4.4 g/dL (ref 3.5–5.2)
Alkaline Phosphatase: 64 U/L (ref 39–117)
BUN: 16 mg/dL (ref 6–23)
CO2: 31 mEq/L (ref 19–32)
Calcium: 9.4 mg/dL (ref 8.4–10.5)
Chloride: 101 mEq/L (ref 96–112)
Creatinine, Ser: 1.01 mg/dL (ref 0.40–1.50)
GFR: 87.82 mL/min (ref >60.00)
Glucose, Bld: 106 mg/dL — ABNORMAL HIGH (ref 70–99)
Potassium: 4.3 mEq/L (ref 3.5–5.1)
Sodium: 140 mEq/L (ref 135–145)
Total Bilirubin: 0.5 mg/dL (ref 0.2–1.2)
Total Protein: 6.8 g/dL (ref 6.0–8.3)

## 2021-09-20 LAB — LIPID PANEL
Cholesterol: 209 mg/dL — ABNORMAL HIGH (ref 0–200)
HDL: 46.1 mg/dL (ref >39.00)
LDL Cholesterol: 135 mg/dL — ABNORMAL HIGH (ref 0–99)
NonHDL: 162.64
Total CHOL/HDL Ratio: 5
Triglycerides: 136 mg/dL (ref 0.0–149.0)
VLDL: 27.2 mg/dL (ref 0.0–40.0)

## 2021-09-20 LAB — HEMOGLOBIN A1C: Hgb A1c MFr Bld: 5.6 % (ref 4.6–6.5)

## 2021-09-20 LAB — VITAMIN D 25 HYDROXY (VIT D DEFICIENCY, FRACTURES): VITD: 24.31 ng/mL — ABNORMAL LOW (ref 30.00–100.00)

## 2021-09-20 LAB — FOLATE: Folate: 17.8 ng/mL (ref >5.9)

## 2021-09-20 LAB — VITAMIN B12: Vitamin B-12: 162 pg/mL — ABNORMAL LOW (ref 211–911)

## 2021-09-23 LAB — HEPATITIS C ANTIBODY: Hepatitis C Ab: NONREACTIVE

## 2021-09-27 ENCOUNTER — Ambulatory Visit: Payer: No Typology Code available for payment source | Admitting: Family Medicine

## 2021-09-27 ENCOUNTER — Encounter: Payer: Self-pay | Admitting: Family Medicine

## 2021-09-27 DIAGNOSIS — Z0000 Encounter for general adult medical examination without abnormal findings: Secondary | ICD-10-CM | POA: Diagnosis not present

## 2021-09-27 DIAGNOSIS — E559 Vitamin D deficiency, unspecified: Secondary | ICD-10-CM

## 2021-09-27 DIAGNOSIS — K219 Gastro-esophageal reflux disease without esophagitis: Secondary | ICD-10-CM

## 2021-09-27 DIAGNOSIS — E669 Obesity, unspecified: Secondary | ICD-10-CM | POA: Diagnosis not present

## 2021-09-27 DIAGNOSIS — E538 Deficiency of other specified B group vitamins: Secondary | ICD-10-CM

## 2021-09-27 MED ORDER — CYANOCOBALAMIN 1000 MCG/ML IJ SOLN
1000.0000 ug | Freq: Once | INTRAMUSCULAR | Status: AC
  Administered 2021-09-27: 1000 ug via INTRAMUSCULAR

## 2021-09-27 NOTE — Progress Notes (Signed)
Patient ID: Phillip Evans, male    DOB: [birth year], 51 y.o.   MRN: [id number]  This visit was conducted in person.  BP 138/70 (BP Location: Right Arm, Cuff Size: Large)   Pulse 68   Temp 97.9 F (36.6 C) (Temporal)   Ht 5' 8.5" (1.74 m)   Wt 240 lb 6 oz (109 kg)   SpO2 97%   BMI 36.02 kg/m   BP Readings from Last 3 Encounters:  09/27/21 138/70  08/13/21 112/74  01/31/21 132/82   CC: CPE Subjective:   HPI: Phillip Evans is a 51 y.o. male presenting on 09/27/2021 for Annual Exam   Bilateral hand numbness - found to have vit B12 deficiency, hasn't been regularly with oral replacement. He's been trying to drink drinks that have b12. No significant paresthesias at this time, normal energy levels. He's been using wrist brace/gloves with significant improvement.   H/o R hand surgery 1994 with tendon reconstruction. Chronic paresthesias/numbness to R 4th/5th digits since then  GERD - controlled on nexium '20mg'$  daily. Previously used dexilant.   Upcoming beach vacation   Preventative: Colonoscopy - 01/2020 5 polyps (TA and SSP), rpt 5 yrs ([facility])  Lung cancer screen - not eligible  Flu shot - deferred  [place] 04/2019 x2, no booster Tdap 2012, h/o local reaction - deferred  Seat belt use discussed Sunscreen use discussed, no changing moles on skin.  Non smoker  Alcohol - seldom Dentist - yearly  Eye exam - none recently.   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 Interim medical history since our last visit reviewed. Allergies and medications reviewed and updated. Outpatient Medications Prior to Visit  Medication Sig Dispense Refill   cyanocobalamin (VITAMIN B12) 1000 MCG/ML injection Inject 1 mL (1,000 mcg  total) into the muscle every 30 (thirty) days.     esomeprazole (NEXIUM) 20 MG capsule Take 20 mg by mouth daily at 12 noon.     meclizine (ANTIVERT) 25 MG tablet Take 1 tablet (25 mg total) by mouth 3 (three) times daily as needed for dizziness. 90 tablet 1   b complex vitamins capsule Take 1 capsule by mouth daily.     No facility-administered medications prior to visit.     Per HPI unless specifically indicated in ROS section below Review of Systems  Constitutional:  Negative for activity change, appetite change, chills, fatigue, fever and unexpected weight change.  HENT:  Negative for hearing loss.   Eyes:  Negative for visual disturbance.  Respiratory:  Negative for cough, chest tightness, shortness of breath and wheezing.   Cardiovascular:  Negative for chest pain, palpitations and leg swelling.  Gastrointestinal:  Negative for abdominal distention, abdominal pain, blood in stool, constipation, diarrhea, nausea and vomiting.  Genitourinary:  Negative for difficulty urinating and hematuria.  Musculoskeletal:  Negative for arthralgias, myalgias and neck pain.  Skin:  Negative for rash.  Neurological:  Negative for dizziness, seizures, syncope and headaches.  Hematological:  Negative for adenopathy. Does not bruise/bleed easily.  Psychiatric/Behavioral:  Negative for dysphoric mood. The patient is not nervous/anxious.     Objective:  BP 138/70 (BP Location: Right Arm, Cuff Size: Large)   Pulse 68   Temp 97.9 F (36.6 C) (Temporal)  Ht 5' 8.5" (1.74 m)   Wt 240 lb 6 oz (109 kg)   SpO2 97%   BMI 36.02 kg/m   Wt Readings from Last 3 Encounters:  09/27/21 240 lb 6 oz (109 kg)  08/13/21 241 lb (109.3 kg)  01/31/21 240 lb 8 oz (109.1 kg)      Physical Exam Vitals and nursing note reviewed.  Constitutional:      General: He is not in acute distress.    Appearance: Normal appearance. He is well-developed. He is not ill-appearing.  HENT:     Head: Normocephalic and  atraumatic.     Right Ear: Hearing, tympanic membrane, ear canal and external ear normal.     Left Ear: Hearing, tympanic membrane, ear canal and external ear normal.  Eyes:     General: No scleral icterus.    Extraocular Movements: Extraocular movements intact.     Conjunctiva/sclera: Conjunctivae normal.     Pupils: Pupils are equal, round, and reactive to light.  Neck:     Thyroid: No thyroid mass or thyromegaly.  Cardiovascular:     Rate and Rhythm: Normal rate and regular rhythm.     Pulses: Normal pulses.          Radial pulses are 2+ on the right side and 2+ on the left side.     Heart sounds: Normal heart sounds. No murmur heard. Pulmonary:     Effort: Pulmonary effort is normal. No respiratory distress.     Breath sounds: Normal breath sounds. No wheezing, rhonchi or rales.  Abdominal:     General: Bowel sounds are normal. There is no distension.     Palpations: Abdomen is soft. There is no mass.     Tenderness: There is no abdominal tenderness. There is no guarding or rebound.     Hernia: No hernia is present.  Musculoskeletal:        General: Normal range of motion.     Cervical back: Normal range of motion and neck supple.     Right lower leg: No edema.     Left lower leg: No edema.  Lymphadenopathy:     Cervical: No cervical adenopathy.  Skin:    General: Skin is warm and dry.     Findings: No rash.  Neurological:     General: No focal deficit present.     Mental Status: He is alert and oriented to person, place, and time.  Psychiatric:        Mood and Affect: Mood normal.        Behavior: Behavior normal.        Thought Content: Thought content normal.        Judgment: Judgment normal.       Results for orders placed or performed in visit on 09/20/21  VITAMIN D 25 Hydroxy (Vit-D Deficiency, Fractures)  Result Value Ref Range   VITD 24.31 (L) 30.00 - 100.00 ng/mL  Folate  Result Value Ref Range   Folate 17.8 >5.9 ng/mL  Hepatitis C antibody  Result  Value Ref Range   Hepatitis C Ab NON-REACTIVE NON-REACTIVE  Hemoglobin A1c  Result Value Ref Range   Hgb A1c MFr Bld 5.6 4.6 - 6.5 %  Comprehensive metabolic panel  Result Value Ref Range   Sodium 140 135 - 145 mEq/L   Potassium 4.3 3.5 - 5.1 mEq/L   Chloride 101 96 - 112 mEq/L   CO2 31 19 - 32 mEq/L   Glucose, Bld 106 (H) 70 -  99 mg/dL   BUN 16 6 - 23 mg/dL   Creatinine, Ser 1.01 0.40 - 1.50 mg/dL   Total Bilirubin 0.5 0.2 - 1.2 mg/dL   Alkaline Phosphatase 64 39 - 117 U/L   AST 14 0 - 37 U/L   ALT 19 0 - 53 U/L   Total Protein 6.8 6.0 - 8.3 g/dL   Albumin 4.4 3.5 - 5.2 g/dL   GFR 87.82 >60.00 mL/min   Calcium 9.4 8.4 - 10.5 mg/dL  Lipid panel  Result Value Ref Range   Cholesterol 209 (H) 0 - 200 mg/dL   Triglycerides 136.0 0.0 - 149.0 mg/dL   HDL 46.10 >39.00 mg/dL   VLDL 27.2 0.0 - 40.0 mg/dL   LDL Cholesterol 135 (H) 0 - 99 mg/dL   Total CHOL/HDL Ratio 5    NonHDL 162.64   Vitamin B12  Result Value Ref Range   Vitamin B-12 162 (L) 211 - 911 pg/mL    Assessment & Plan:   Problem List Items Addressed This Visit     Health maintenance examination (Chronic)    Preventative protocols reviewed and updated unless pt declined. Discussed healthy diet and lifestyle.       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       Vitamin B12 deficiency    Ongoing. Start b12 shots monthly x 6 months then reassess levels. Pt agrees with plan.       GERD (gastroesophageal reflux disease)    Longstanding GERD controlled with nexium '20mg'$  daily. S/p EGD 01/2020. Anticipate PPI use contributes to b12 deficiency. He desires to try and taper off PPI - will drop nexium to QOD dosing, discussed pepcid use in interim.       Vitamin D deficiency    rec start vit D 1000 IU daily.         Meds ordered this encounter  Medications   cyanocobalamin (VITAMIN B12) injection 1,000 mcg    No orders of the defined types were placed in this encounter.    Patient instructions: Start B12 shots - monthly, first one today. Do this for 6 months then recheck B12.  Cut down on nexium '20mg'$  to every other day - try this, may use pepcid over the counter on alternate days or as needed.  Start vitamin D3 1000 units daily over the counter.  You are doing well today Return as needed or in 6 months for labs (1 month after last B12 shot) and 1 year for next physical.   Follow up plan: Return in about 1 year (around 09/28/2022) for annual exam, prior fasting for blood work.  Ria Bush, MD

## 2021-09-27 NOTE — Patient Instructions (Addendum)
Start B12 shots - monthly, first one today. Do this for 6 months then recheck B12.  Cut down on nexium '20mg'$  to every other day - try this, may use pepcid over the counter on alternate days or as needed.  Start vitamin D3 1000 units daily over the counter.  You are doing well today Return as needed or in 6 months for labs (1 month after last B12 shot) and 1 year for next physical.   Health Maintenance, Male Adopting a healthy lifestyle and getting preventive care are important in promoting health and wellness. Ask your health care provider about: The right schedule for you to have regular tests and exams. Things you can do on your own to prevent diseases and keep yourself healthy. What should I know about diet, weight, and exercise? Eat a healthy diet  Eat a diet that includes plenty of vegetables, fruits, low-fat dairy products, and lean protein. Do not eat a lot of foods that are high in solid fats, added sugars, or sodium. Maintain a healthy weight Body mass index (BMI) is a measurement that can be used to identify possible weight problems. It estimates body fat based on height and weight. Your health care provider can help determine your BMI and help you achieve or maintain a healthy weight. Get regular exercise Get regular exercise. This is one of the most important things you can do for your health. Most adults should: Exercise for at least 150 minutes each week. The exercise should increase your heart rate and make you sweat (moderate-intensity exercise). Do strengthening exercises at least twice a week. This is in addition to the moderate-intensity exercise. Spend less time sitting. Even light physical activity can be beneficial. Watch cholesterol and blood lipids Have your blood tested for lipids and cholesterol at 49 years of age, then have this test every 5 years. You may need to have your cholesterol levels checked more often if: Your lipid or cholesterol levels are high. You are  older than 49 years of age. You are at high risk for heart disease. What should I know about cancer screening? Many types of cancers can be detected early and may often be prevented. Depending on your health history and family history, you may need to have cancer screening at various ages. This may include screening for: Colorectal cancer. Prostate cancer. Skin cancer. Lung cancer. What should I know about heart disease, diabetes, and high blood pressure? Blood pressure and heart disease High blood pressure causes heart disease and increases the risk of stroke. This is more likely to develop in people who have high blood pressure readings or are overweight. Talk with your health care provider about your target blood pressure readings. Have your blood pressure checked: Every 3-5 years if you are 80-84 years of age. Every year if you are 61 years old or older. If you are between the ages of 83 and 68 and are a current or former smoker, ask your health care provider if you should have a one-time screening for abdominal aortic aneurysm (AAA). Diabetes Have regular diabetes screenings. This checks your fasting blood sugar level. Have the screening done: Once every three years after age 62 if you are at a normal weight and have a low risk for diabetes. More often and at a younger age if you are overweight or have a high risk for diabetes. What should I know about preventing infection? Hepatitis B If you have a higher risk for hepatitis B, you should be screened for  this virus. Talk with your health care provider to find out if you are at risk for hepatitis B infection. Hepatitis C Blood testing is recommended for: Everyone born from [birth year] through 1963. Anyone with known risk factors for hepatitis C. Sexually transmitted infections (STIs) You should be screened each year for STIs, including gonorrhea and chlamydia, if: You are sexually active and are younger than 49 years of age. You are older  than 49 years of age and your health care provider tells you that you are at risk for this type of infection. Your sexual activity has changed since you were last screened, and you are at increased risk for chlamydia or gonorrhea. Ask your health care provider if you are at risk. Ask your health care provider about whether you are at high risk for HIV. Your health care provider may recommend a prescription medicine to help prevent HIV infection. If you choose to take medicine to prevent HIV, you should first get tested for HIV. You should then be tested every 3 months for as long as you are taking the medicine. Follow these instructions at home: Alcohol use Do not drink alcohol if your health care provider tells you not to drink. If you drink alcohol: Limit how much you have to 0-2 drinks a day. Know how much alcohol is in your drink. In the [place] one drink equals one 12 oz bottle of beer (355 mL), one 5 oz glass of wine (148 mL), or one 1 oz glass of hard liquor (44 mL). Lifestyle Do not use any products that contain nicotine or tobacco. These products include cigarettes, chewing tobacco, and vaping devices, such as e-cigarettes. If you need help quitting, ask your health care provider. Do not use street drugs. Do not share needles. Ask your health care provider for help if you need support or information about quitting drugs. General instructions Schedule regular health, dental, and eye exams. Stay current with your vaccines. Tell your health care provider if: You often feel depressed. You have ever been abused or do not feel safe at home. Summary Adopting a healthy lifestyle and getting preventive care are important in promoting health and wellness. Follow your health care provider's instructions about healthy diet, exercising, and getting tested or screened for diseases. Follow your health care provider's instructions on monitoring your cholesterol and blood pressure. This information is  not intended to replace advice given to you by your health care provider. Make sure you discuss any questions you have with your health care provider. Document Revised: 06/04/2020 Document Reviewed: 06/04/2020 Elsevier Patient Education  [employer].

## 2021-09-27 NOTE — Assessment & Plan Note (Signed)
Continue to encourage healthy diet and lifestyle choices to affect sustainable weight loss.  °

## 2021-09-27 NOTE — Assessment & Plan Note (Addendum)
Longstanding GERD controlled with nexium '20mg'$  daily. S/p EGD 01/2020. Anticipate PPI use contributes to b12 deficiency. He desires to try and taper off PPI - will drop nexium to QOD dosing, discussed pepcid use in interim.

## 2021-09-27 NOTE — Assessment & Plan Note (Signed)
Preventative protocols reviewed and updated unless pt declined. Discussed healthy diet and lifestyle.  

## 2021-09-27 NOTE — Assessment & Plan Note (Signed)
rec start vit D 1000 IU daily.

## 2021-09-27 NOTE — Assessment & Plan Note (Signed)
Ongoing. Start b12 shots monthly x 6 months then reassess levels. Pt agrees with plan.

## 2021-10-29 ENCOUNTER — Ambulatory Visit: Payer: No Typology Code available for payment source

## 2021-10-30 ENCOUNTER — Ambulatory Visit: Payer: No Typology Code available for payment source

## 2021-11-05 ENCOUNTER — Ambulatory Visit: Payer: No Typology Code available for payment source

## 2021-11-06 ENCOUNTER — Ambulatory Visit (INDEPENDENT_AMBULATORY_CARE_PROVIDER_SITE_OTHER): Payer: No Typology Code available for payment source | Admitting: *Deleted

## 2021-11-06 DIAGNOSIS — E538 Deficiency of other specified B group vitamins: Secondary | ICD-10-CM

## 2021-11-06 MED ORDER — CYANOCOBALAMIN 1000 MCG/ML IJ SOLN
1000.0000 ug | Freq: Once | INTRAMUSCULAR | Status: AC
  Administered 2021-11-06: 1000 ug via INTRAMUSCULAR

## 2021-11-06 NOTE — Progress Notes (Signed)
Per orders of Dr. Gutierrez, injection of Vitamin B-12 given by Triston Skare. Patient tolerated injection well. 

## 2022-03-23 ENCOUNTER — Other Ambulatory Visit: Payer: Self-pay | Admitting: Family Medicine

## 2022-03-23 DIAGNOSIS — E559 Vitamin D deficiency, unspecified: Secondary | ICD-10-CM

## 2022-03-23 DIAGNOSIS — E538 Deficiency of other specified B group vitamins: Secondary | ICD-10-CM

## 2022-03-23 DIAGNOSIS — R739 Hyperglycemia, unspecified: Secondary | ICD-10-CM

## 2022-03-28 ENCOUNTER — Other Ambulatory Visit: Payer: No Typology Code available for payment source

## 2022-09-11 ENCOUNTER — Encounter (INDEPENDENT_AMBULATORY_CARE_PROVIDER_SITE_OTHER): Payer: Self-pay

## 2022-09-22 ENCOUNTER — Other Ambulatory Visit: Payer: Self-pay | Admitting: Family Medicine

## 2022-09-22 DIAGNOSIS — R739 Hyperglycemia, unspecified: Secondary | ICD-10-CM

## 2022-09-22 DIAGNOSIS — E7800 Pure hypercholesterolemia, unspecified: Secondary | ICD-10-CM

## 2022-09-22 DIAGNOSIS — E559 Vitamin D deficiency, unspecified: Secondary | ICD-10-CM

## 2022-09-22 DIAGNOSIS — E538 Deficiency of other specified B group vitamins: Secondary | ICD-10-CM

## 2022-09-22 DIAGNOSIS — E669 Obesity, unspecified: Secondary | ICD-10-CM

## 2022-09-22 DIAGNOSIS — E785 Hyperlipidemia, unspecified: Secondary | ICD-10-CM | POA: Insufficient documentation

## 2022-09-23 ENCOUNTER — Other Ambulatory Visit (INDEPENDENT_AMBULATORY_CARE_PROVIDER_SITE_OTHER): Payer: No Typology Code available for payment source

## 2022-09-23 DIAGNOSIS — E559 Vitamin D deficiency, unspecified: Secondary | ICD-10-CM | POA: Diagnosis not present

## 2022-09-23 DIAGNOSIS — E538 Deficiency of other specified B group vitamins: Secondary | ICD-10-CM | POA: Diagnosis not present

## 2022-09-23 DIAGNOSIS — E7800 Pure hypercholesterolemia, unspecified: Secondary | ICD-10-CM | POA: Diagnosis not present

## 2022-09-23 DIAGNOSIS — R739 Hyperglycemia, unspecified: Secondary | ICD-10-CM

## 2022-09-23 LAB — LIPID PANEL
Cholesterol: 204 mg/dL — ABNORMAL HIGH (ref 0–200)
HDL: 42.6 mg/dL (ref >39.00)
NonHDL: 161.84
Total CHOL/HDL Ratio: 5
Triglycerides: 246 mg/dL — ABNORMAL HIGH (ref 0.0–149.0)
VLDL: 49.2 mg/dL — ABNORMAL HIGH (ref 0.0–40.0)

## 2022-09-23 LAB — COMPREHENSIVE METABOLIC PANEL
ALT: 26 U/L (ref 0–53)
AST: 16 U/L (ref 0–37)
Albumin: 4.2 g/dL (ref 3.5–5.2)
Alkaline Phosphatase: 68 U/L (ref 39–117)
BUN: 13 mg/dL (ref 6–23)
CO2: 30 mEq/L (ref 19–32)
Calcium: 9.5 mg/dL (ref 8.4–10.5)
Chloride: 101 mEq/L (ref 96–112)
Creatinine, Ser: 1.03 mg/dL (ref 0.40–1.50)
GFR: 85.17 mL/min (ref >60.00)
Glucose, Bld: 101 mg/dL — ABNORMAL HIGH (ref 70–99)
Potassium: 4.4 mEq/L (ref 3.5–5.1)
Sodium: 140 mEq/L (ref 135–145)
Total Bilirubin: 0.5 mg/dL (ref 0.2–1.2)
Total Protein: 6.9 g/dL (ref 6.0–8.3)

## 2022-09-23 LAB — LDL CHOLESTEROL, DIRECT: Direct LDL: 150 mg/dL

## 2022-09-23 LAB — VITAMIN D 25 HYDROXY (VIT D DEFICIENCY, FRACTURES): VITD: 21.82 ng/mL — ABNORMAL LOW (ref 30.00–100.00)

## 2022-09-23 LAB — HEMOGLOBIN A1C: Hgb A1c MFr Bld: 5.6 % (ref 4.6–6.5)

## 2022-09-23 LAB — VITAMIN B12: Vitamin B-12: 174 pg/mL — ABNORMAL LOW (ref 211–911)

## 2022-09-30 ENCOUNTER — Encounter: Payer: Self-pay | Admitting: Family Medicine

## 2022-09-30 ENCOUNTER — Ambulatory Visit (INDEPENDENT_AMBULATORY_CARE_PROVIDER_SITE_OTHER): Payer: No Typology Code available for payment source | Admitting: Family Medicine

## 2022-09-30 VITALS — BP 136/82 | HR 67 | Temp 97.1°F | Ht 68.5 in | Wt 245.4 lb

## 2022-09-30 DIAGNOSIS — E559 Vitamin D deficiency, unspecified: Secondary | ICD-10-CM

## 2022-09-30 DIAGNOSIS — R42 Dizziness and giddiness: Secondary | ICD-10-CM

## 2022-09-30 DIAGNOSIS — E538 Deficiency of other specified B group vitamins: Secondary | ICD-10-CM

## 2022-09-30 DIAGNOSIS — E669 Obesity, unspecified: Secondary | ICD-10-CM

## 2022-09-30 DIAGNOSIS — Z0000 Encounter for general adult medical examination without abnormal findings: Secondary | ICD-10-CM | POA: Diagnosis not present

## 2022-09-30 DIAGNOSIS — E782 Mixed hyperlipidemia: Secondary | ICD-10-CM

## 2022-09-30 DIAGNOSIS — K219 Gastro-esophageal reflux disease without esophagitis: Secondary | ICD-10-CM

## 2022-09-30 DIAGNOSIS — E66812 Obesity, class 2: Secondary | ICD-10-CM

## 2022-09-30 MED ORDER — MECLIZINE HCL 25 MG PO TABS: 25.0000 mg | ORAL_TABLET | Freq: Two times a day (BID) | ORAL | Status: DC | PRN

## 2022-09-30 MED ORDER — CYANOCOBALAMIN 1000 MCG/ML IJ SOLN
1000.0000 ug | Freq: Once | INTRAMUSCULAR | Status: AC
  Administered 2022-09-30: 1000 ug via INTRAMUSCULAR

## 2022-09-30 NOTE — Assessment & Plan Note (Signed)
Chronic, deteriorated. Reviewed diet choices to improve triglyceride levels. The 10-year ASCVD risk score (Arnett DK, et al., 2019) is: 4.3%   Values used to calculate the score:     Age: 52 years     Sex: Male     Is Non-Hispanic African American: No     Diabetic: No     Tobacco smoker: No     Systolic Blood Pressure: 136 mmHg     Is BP treated: No     HDL Cholesterol: 42.6 mg/dL     Total Cholesterol: 204 mg/dL

## 2022-09-30 NOTE — Assessment & Plan Note (Signed)
Preventative protocols reviewed and updated unless pt declined. Discussed healthy diet and lifestyle.  

## 2022-09-30 NOTE — Patient Instructions (Addendum)
B12 shot today then weekly for 1 month then monthly for 6 months. Repeat lab test the day of your last b12 shot (at 6th month), do labs prior to shot.  Start vitamin D3 1000 units daily over the counter.  Work on regular exercise routine.  Good to see you today Return as needed or in 1 year for next physical.

## 2022-09-30 NOTE — Assessment & Plan Note (Addendum)
Ongoing, recurrent, seems to be more affecting left ear - ?meniere's however no tinnitus/hearing change. Will continue to monitor with PRN dramamine.  Discussed caution with caffeine, sodium intake.

## 2022-09-30 NOTE — Assessment & Plan Note (Signed)
Rec start vit D3 1000 international units  daily.

## 2022-09-30 NOTE — Assessment & Plan Note (Signed)
Continues nexium 20mg  daily with occ skipped dose.  S/p EGD 01/2020.  Anticipate PPI use contributes to B12 deficiency.

## 2022-09-30 NOTE — Assessment & Plan Note (Addendum)
Reviewed longstanding history of B12 deficiency. Recommend weekly b12 shots for 4 wks then monthly b12 shots for 6 months then reassess. Check IF Ab next labwork.

## 2022-09-30 NOTE — Progress Notes (Signed)
Ph: [phone] Fax: [phone]   Patient ID: Phillip Evans, male    DOB: 1970/02/27, 52 y.o.   MRN: [id number]  This visit was conducted in person.  BP 136/82   Pulse 67   Temp (!) 97.1 F (36.2 C) (Temporal)   Ht 5' 8.5" (1.74 m)   Wt 245 lb 6 oz (111.3 kg)   SpO2 97%   BMI 36.77 kg/m    CC: CPE Subjective:   HPI: Phillip Evans is a 52 y.o. male presenting on 09/30/2022 for Annual Exam   GERD - managed with nexium 20mg  daily.  Vit B12 deficiency - received b12 shots x 2 months then stopped. He's not taking oral replacement. He developed bilateral hand numbness - that did improve with wrist braces ?CTS. He had trouble scheduling B12 shots.  Vit D deficiency - not on replacement.   He intermittently takes b complex vitamin.  Ongoing intermittent vertigo - dehydration is a trigger. Predominantly left side + nausea. No associated hearing change or tinnitus. No vomiting.   Preventative: Colonoscopy - 01/2020 5 polyps (TA and SSP), rpt 5 yrs ([facility])  Lung cancer screen - not eligible  Flu shot - deferred  COVID vaccine [employer] 04/2019 x2, no booster Tdap 2012, h/o local reaction - deferred  Seat belt use discussed Sunscreen use discussed, no changing moles on skin. Wears sun shirt at beach.  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 Interim medical history since our last visit reviewed. Allergies and medications reviewed and updated. Outpatient Medications Prior to Visit  Medication Sig Dispense Refill   esomeprazole (NEXIUM) 20 MG capsule Take 20 mg by mouth daily at 12 noon.     meclizine (ANTIVERT) 25 MG tablet Take 1 tablet (25  mg total) by mouth 3 (three) times daily as needed for dizziness. 90 tablet 1   cyanocobalamin (VITAMIN B12) 1000 MCG/ML injection Inject 1 mL (1,000 mcg total) into the muscle every 30 (thirty) days. (Patient not taking: Reported on 09/30/2022)     No facility-administered medications prior to visit.     Per HPI unless specifically indicated in ROS section below Review of Systems  Constitutional:  Negative for activity change, appetite change, chills, fatigue, fever and unexpected weight change.  HENT:  Positive for congestion and sinus pressure. Negative for hearing loss and tinnitus.   Eyes:  Negative for visual disturbance.  Respiratory:  Negative for cough, chest tightness, shortness of breath and wheezing.   Cardiovascular:  Positive for leg swelling (occ left foot swelling). Negative for chest pain and palpitations.  Gastrointestinal:  Negative for abdominal distention, abdominal pain, blood in stool, constipation, diarrhea, nausea and vomiting.  Genitourinary:  Negative for difficulty urinating and hematuria.  Musculoskeletal:  Negative for arthralgias, myalgias and neck pain.  Skin:  Negative for rash.  Neurological:  Positive for dizziness (vertigo). Negative for seizures, syncope, numbness and headaches.  Hematological:  Negative for adenopathy. Does not bruise/bleed easily.  Psychiatric/Behavioral:  Negative for dysphoric mood. The patient is not nervous/anxious.     Objective:  BP 136/82   Pulse 67   Temp (!) 97.1 F (36.2 C) (  Temporal)   Ht 5' 8.5" (1.74 m)   Wt 245 lb 6 oz (111.3 kg)   SpO2 97%   BMI 36.77 kg/m   Wt Readings from Last 3 Encounters:  09/30/22 245 lb 6 oz (111.3 kg)  09/27/21 240 lb 6 oz (109 kg)  08/13/21 241 lb (109.3 kg)      Physical Exam Vitals and nursing note reviewed.  Constitutional:      General: He is not in acute distress.    Appearance: Normal appearance. He is well-developed. He is not ill-appearing.  HENT:     Head: Normocephalic  and atraumatic.     Right Ear: Hearing, tympanic membrane, ear canal and external ear normal.     Left Ear: Hearing, tympanic membrane, ear canal and external ear normal.     Mouth/Throat:     Mouth: Mucous membranes are moist.     Pharynx: Oropharynx is clear. No oropharyngeal exudate or posterior oropharyngeal erythema.  Eyes:     General: No scleral icterus.    Extraocular Movements: Extraocular movements intact.     Conjunctiva/sclera: Conjunctivae normal.     Pupils: Pupils are equal, round, and reactive to light.  Neck:     Thyroid: No thyroid mass or thyromegaly.  Cardiovascular:     Rate and Rhythm: Normal rate and regular rhythm.     Pulses: Normal pulses.          Radial pulses are 2+ on the right side and 2+ on the left side.     Heart sounds: Normal heart sounds. No murmur heard. Pulmonary:     Effort: Pulmonary effort is normal. No respiratory distress.     Breath sounds: Normal breath sounds. No wheezing, rhonchi or rales.  Abdominal:     General: Bowel sounds are normal. There is no distension.     Palpations: Abdomen is soft. There is no mass.     Tenderness: There is no abdominal tenderness. There is no guarding or rebound.     Hernia: No hernia is present.  Musculoskeletal:        General: Normal range of motion.     Cervical back: Normal range of motion and neck supple.     Right lower leg: No edema.     Left lower leg: No edema.  Lymphadenopathy:     Cervical: No cervical adenopathy.  Skin:    General: Skin is warm and dry.     Findings: No rash.  Neurological:     General: No focal deficit present.     Mental Status: He is alert and oriented to person, place, and time.  Psychiatric:        Mood and Affect: Mood normal.        Behavior: Behavior normal.        Thought Content: Thought content normal.        Judgment: Judgment normal.       Results for orders placed or performed in visit on 09/23/22  Hemoglobin A1c  Result Value Ref Range   Hgb A1c  MFr Bld 5.6 4.6 - 6.5 %  Comprehensive metabolic panel  Result Value Ref Range   Sodium 140 135 - 145 mEq/L   Potassium 4.4 3.5 - 5.1 mEq/L   Chloride 101 96 - 112 mEq/L   CO2 30 19 - 32 mEq/L   Glucose, Bld 101 (H) 70 - 99 mg/dL   BUN 13 6 - 23 mg/dL   Creatinine, Ser 6.21 0.40 - 1.50 mg/dL  Total Bilirubin 0.5 0.2 - 1.2 mg/dL   Alkaline Phosphatase 68 39 - 117 U/L   AST 16 0 - 37 U/L   ALT 26 0 - 53 U/L   Total Protein 6.9 6.0 - 8.3 g/dL   Albumin 4.2 3.5 - 5.2 g/dL   GFR 11.91 >47.82 mL/min   Calcium 9.5 8.4 - 10.5 mg/dL  Lipid panel  Result Value Ref Range   Cholesterol 204 (H) 0 - 200 mg/dL   Triglycerides 956.2 (H) 0.0 - 149.0 mg/dL   HDL 13.08 >65.78 mg/dL   VLDL 46.9 (H) 0.0 - 62.9 mg/dL   Total CHOL/HDL Ratio 5    NonHDL 161.84   VITAMIN D 25 Hydroxy (Vit-D Deficiency, Fractures)  Result Value Ref Range   VITD 21.82 (L) 30.00 - 100.00 ng/mL  Vitamin B12  Result Value Ref Range   Vitamin B-12 174 (L) 211 - 911 pg/mL  LDL cholesterol, direct  Result Value Ref Range   Direct LDL 150.0 mg/dL    Assessment & Plan:   Problem List Items Addressed This Visit     Health maintenance examination - Primary (Chronic)    Preventative protocols reviewed and updated unless pt declined. Discussed healthy diet and lifestyle.       Obesity, Class II, BMI 35-39.9, no comorbidity (HCC)    Encourage healthy diet and lifestyle choices to affect sustainable weight loss. Consider incorporating walking with daughter into routine.       Vitamin B12 deficiency    Reviewed longstanding history of B12 deficiency. Recommend weekly b12 shots for 4 wks then monthly b12 shots for 6 months then reassess. Check IF Ab next labwork.       GERD (gastroesophageal reflux disease)    Continues nexium 20mg  daily with occ skipped dose.  S/p EGD 01/2020.  Anticipate PPI use contributes to B12 deficiency.       Relevant Medications   meclizine (DRAMAMINE) 25 MG tablet   Vertigo    Ongoing,  recurrent, seems to be more affecting left ear - ?meniere's however no tinnitus/hearing change. Will continue to monitor with PRN dramamine.  Discussed caution with caffeine, sodium intake.       Vitamin D deficiency    Rec start vit D3 1000 international units  daily.       HLD (hyperlipidemia)    Chronic, deteriorated. Reviewed diet choices to improve triglyceride levels. The 10-year ASCVD risk score (Arnett DK, et al., 2019) is: 4.3%   Values used to calculate the score:     Age: 67 years     Sex: Male     Is Non-Hispanic African American: No     Diabetic: No     Tobacco smoker: No     Systolic Blood Pressure: 136 mmHg     Is BP treated: No     HDL Cholesterol: 42.6 mg/dL     Total Cholesterol: 204 mg/dL         Meds ordered this encounter  Medications   meclizine (DRAMAMINE) 25 MG tablet    Sig: Take 1 tablet (25 mg total) by mouth 2 (two) times daily as needed for dizziness.   cyanocobalamin (VITAMIN B12) injection 1,000 mcg    No orders of the defined types were placed in this encounter.   Patient Instructions  B12 shot today then weekly for 1 month then monthly for 6 months. Repeat lab test the day of your last b12 shot (at 6th month), do labs prior to shot.  Start vitamin  D3 1000 units daily over the counter.  Work on regular exercise routine.  Good to see you today Return as needed or in 1 year for next physical.   Follow up plan: Return in about 1 year (around 09/30/2023) for annual exam, prior fasting for blood work.  Eustaquio Boyden, MD

## 2022-09-30 NOTE — Assessment & Plan Note (Signed)
Encourage healthy diet and lifestyle choices to affect sustainable weight loss. Consider incorporating walking with daughter into routine.

## 2022-10-08 ENCOUNTER — Ambulatory Visit: Payer: No Typology Code available for payment source

## 2022-10-15 ENCOUNTER — Telehealth: Payer: Self-pay

## 2022-10-15 ENCOUNTER — Ambulatory Visit (INDEPENDENT_AMBULATORY_CARE_PROVIDER_SITE_OTHER): Payer: No Typology Code available for payment source

## 2022-10-15 DIAGNOSIS — E538 Deficiency of other specified B group vitamins: Secondary | ICD-10-CM | POA: Diagnosis not present

## 2022-10-15 MED ORDER — CYANOCOBALAMIN 1000 MCG/ML IJ SOLN
1000.0000 ug | Freq: Once | INTRAMUSCULAR | Status: AC
  Administered 2022-10-15: 1000 ug via INTRAMUSCULAR

## 2022-10-15 NOTE — Telephone Encounter (Signed)
Spoke with pt relaying Dr Timoteo Expose message. Pt verbalizes understanding and will schedule OV if ongoing urinary sxs.

## 2022-10-15 NOTE — Telephone Encounter (Signed)
Pt came for 2nd vitamin b 12 injection on 10/15/22. Pt said one week after first vitamin b 12 injection pt had burning upon urination only one time.no other symptoms; no pain upon urination, no frequency of urine, no blood. No problem except the one time. Pt wants to know if that is side effect of vitamin b 12. Pt did want to go ahead and get vitamin b 12 injection today and pt will cb if any concerns. Pt request cb after review by Dr Reece Agar. Sending note to Dr Reece Agar. [facility].

## 2022-10-15 NOTE — Telephone Encounter (Signed)
This shouldn't be effect of B12 shot.  However to monitor after this B12 shot and let us know if recurrent issue 1 wk after, or if any ongoing symptoms to check urine for infection.

## 2022-10-15 NOTE — Progress Notes (Signed)
Per orders of Dr. Eustaquio Boyden, injection of vitamin b 12  given by Lewanda Rife in right deltoid. Patient tolerated injection well. Patient will make appointment for 1 week.

## 2022-10-22 ENCOUNTER — Ambulatory Visit (INDEPENDENT_AMBULATORY_CARE_PROVIDER_SITE_OTHER): Payer: No Typology Code available for payment source

## 2022-10-22 DIAGNOSIS — E538 Deficiency of other specified B group vitamins: Secondary | ICD-10-CM | POA: Diagnosis not present

## 2022-10-22 MED ORDER — CYANOCOBALAMIN 1000 MCG/ML IJ SOLN
1000.0000 ug | Freq: Once | INTRAMUSCULAR | Status: AC
  Administered 2022-10-22: 1000 ug via INTRAMUSCULAR

## 2022-10-22 NOTE — Progress Notes (Signed)
Per orders of Dr. Eustaquio Boyden, injection of vitamin b 12 given by Lewanda Rife in left deltoid. Patient tolerated injection well. Patient will make appointment for 1 week.

## 2022-10-29 ENCOUNTER — Ambulatory Visit (INDEPENDENT_AMBULATORY_CARE_PROVIDER_SITE_OTHER): Payer: No Typology Code available for payment source

## 2022-10-29 DIAGNOSIS — E538 Deficiency of other specified B group vitamins: Secondary | ICD-10-CM | POA: Diagnosis not present

## 2022-10-29 MED ORDER — CYANOCOBALAMIN 1000 MCG/ML IJ SOLN
1000.0000 ug | Freq: Once | INTRAMUSCULAR | Status: AC
  Administered 2022-10-29: 1000 ug via INTRAMUSCULAR

## 2022-10-29 NOTE — Progress Notes (Signed)
Per orders of Dr. Eustaquio Boyden, injection of vitamin b 12 given by Lewanda Rife in right deltoid. Patient tolerated injection well. Patient will make appointment for 1 week. Today is # 3 wkly.

## 2022-10-31 ENCOUNTER — Telehealth: Payer: Self-pay | Admitting: Family Medicine

## 2022-10-31 DIAGNOSIS — R42 Dizziness and giddiness: Secondary | ICD-10-CM

## 2022-10-31 NOTE — Telephone Encounter (Signed)
Patient called in and stated that he would like to go ahead and get the referral to an ENT office. He stated that he prefers a location in [place]. Thank you!

## 2022-10-31 NOTE — Telephone Encounter (Signed)
Referral placed to ENT for vertigo

## 2022-10-31 NOTE — Addendum Note (Signed)
Addended by: Eustaquio Boyden on: 10/31/2022 01:56 PM   Modules accepted: Orders

## 2022-11-04 ENCOUNTER — Telehealth: Payer: Self-pay

## 2022-11-04 NOTE — Telephone Encounter (Signed)
Received faxed notice from [facility] stating referred pt has appt on 11/14/22 at 10:15 AM with Clarise Cruz, PA at [address], [address], [place], [place] [address].

## 2022-11-04 NOTE — Telephone Encounter (Signed)
Attempted to contact pt. No answer. Vm box full. Need to notify pt of appt below.   Will also send message via MyChart and mail.

## 2022-11-07 NOTE — Telephone Encounter (Signed)
I have mailed information to pt about the appointment.

## 2023-07-07 ENCOUNTER — Ambulatory Visit: Payer: Self-pay

## 2023-07-07 NOTE — Telephone Encounter (Signed)
  FYI Only or Action Required?: FYI only for provider  Patient was last seen in primary care on 09/30/2022 by Claire Crick, MD. Called Nurse Triage reporting Dizziness and Ingrown Nail. Symptoms began several weeks ago. Interventions attempted: OTC medications: dramamine and Other: see notes. Symptoms are: gradually worsening.  Triage Disposition: See PCP When Office is Open (Within 3 Days)  Patient/caregiver understands and will follow disposition?: Yes         Copied from CRM [id number]. Topic: Appointments - Appointment Scheduling >> Jul 07, 2023 10:02 AM Emylou G wrote: Vertigo is worsening and needs referral for toe nail remover - ingrown nails.. Reason for Disposition  [1] MODERATE dizziness (e.g., vertigo; feels very unsteady, interferes with normal activities) AND [2] has been evaluated by doctor (or NP/PA) for this  Answer Assessment - Initial Assessment Questions 1. DESCRIPTION: "Describe your dizziness."     Patient describes it consistent with prior episodes of vertigo 2. VERTIGO: "Do you feel like either you or the room is spinning or tilting?"      Room is spinning 3. LIGHTHEADED: "Do you feel lightheaded?" (e.g., somewhat faint, woozy, weak upon standing)     Patient feels room is spinning. States he closes his eyes and steadies himself and looks at one point in the room until it stops. 4. SEVERITY: "How bad is it?"  "Can you walk?"   - MILD: Feels slightly dizzy and unsteady, but is walking normally.   - MODERATE: Feels unsteady when walking, but not falling; interferes with normal activities (e.g., school, work).   - SEVERE: Unable to walk without falling, or requires assistance to walk without falling.     N/a - see notes 5. ONSET:  "When did the dizziness begin?"     Restarted in March, worsened over this last week 6. AGGRAVATING FACTORS: "Does anything make it worse?" (e.g., standing, change in head position)     Lying flat, and when he turns from one side to  the other while in bed. *Always at night Triggered by dehydration, and also sinus congestion 7. CAUSE: "What do you think is causing the dizziness?"     Vertigo 8. RECURRENT SYMPTOM: "Have you had dizziness before?" If Yes, ask: "When was the last time?" "What happened that time?"     Yes - previous diagnosis of Vertigo that was resolved by PT. No occurrences for 6 months but have started back up as of March. 9. OTHER SYMPTOMS: "Do you have any other symptoms?" (e.g., headache, weakness, numbness, vomiting, earache)     Nauseous  Denies vomiting, weakness or numbness in body, headache  Protocols used: Dizziness - Vertigo-A-AH

## 2023-07-08 ENCOUNTER — Encounter: Payer: Self-pay | Admitting: General Practice

## 2023-07-08 ENCOUNTER — Ambulatory Visit: Admitting: General Practice

## 2023-07-08 VITALS — BP 116/70 | HR 62 | Temp 97.9°F | Ht 68.5 in | Wt 245.0 lb

## 2023-07-08 DIAGNOSIS — R42 Dizziness and giddiness: Secondary | ICD-10-CM | POA: Insufficient documentation

## 2023-07-08 DIAGNOSIS — L600 Ingrowing nail: Secondary | ICD-10-CM | POA: Insufficient documentation

## 2023-07-08 MED ORDER — MECLIZINE HCL 25 MG PO TABS: 25.0000 mg | ORAL_TABLET | Freq: Three times a day (TID) | ORAL | 0 refills | Status: AC | PRN

## 2023-07-08 NOTE — Assessment & Plan Note (Signed)
 Bilateral.  Chronic.   Referral placed for podiatry.

## 2023-07-08 NOTE — Assessment & Plan Note (Signed)
 Suspect could be secondary to vertigo.  Will check cbc and bmp to rule out other causes.

## 2023-07-08 NOTE — Assessment & Plan Note (Signed)
 Recurrent.   Has been evaluated by ENT in the past.  Found PT effective.   Rx sent for Meclizine .  Discussed caution with caffeine and sodium intake. F/u with Dr. Crissie Dome in 1-2 weeks. Consider PT again.

## 2023-07-08 NOTE — Progress Notes (Signed)
 Established Patient Office Visit  Subjective   Patient ID: Phillip Evans, male    DOB: 1970-06-26  Age: 53 y.o. MRN: [id number]  Chief Complaint  Patient presents with   Dizziness    Has had Vertigo in the past. Went to PT for vestibular rehab and it helped. He has been doing his home exercise, but not daily. Recent episode started about 1.5 weeks ago. Takes dramamine for it right now.    Ingrown Toenail    Pt states he advised the agent he needed a referral to a podiatrist to have his great toe nails removed completely.     Dizziness Pertinent negatives include no abdominal pain, chest pain, chills, fever, headaches, nausea or vomiting.    Phillip Evans is a 53 year old male, patient of Dr. Mariam Shingles, with past medical history of GERD, vertigo, HLD, vitamin d  deficiency presents today for an acute visit.   Dizziness- symptoms started 1.5 weeks ago. Feels dizzy all the time especially with leaning his head back. He is able to stop the dizziness before it gets to him feeling like the room spinning Has a history of vertigo and reports this is similar. Has completed PT for vestibular rehab which was effective. He was feeling good after the PT for 6 months until this current episode. Has been doing his home exercises intermittently. Has tried dramamine with no relief. Has been evaluated by ENT in the past.  Ingrown toenail- left and right great toe nail- nail only grows to the side. This is causing him pain. He wants to be referred to have it removed.    Patient Active Problem List   Diagnosis Date Noted   Dizziness 07/08/2023   Ingrown nail of great toe 07/08/2023   HLD (hyperlipidemia) 09/22/2022   Vitamin D  deficiency 09/14/2021   Vertigo 08/13/2021   GERD (gastroesophageal reflux disease) 07/05/2019   Vitamin B12 deficiency 09/27/2015   Right leg injury 05/19/2012   Health maintenance examination 06/11/2010   Obesity, Class II, BMI 35-39.9, no comorbidity (HCC) 06/11/2010    Paresthesia of hand, bilateral 06/11/2010   Past Medical History:  Diagnosis Date   Allergy    COVID-19 virus infection 10/21/2019   GERD (gastroesophageal reflux disease)    Heart murmur    Lactose intolerance    Obesity    Past Surgical History:  Procedure Laterality Date   COLONOSCOPY  01/2020   5 polyps (TA and SSP), rpt 5 yrs ([facility])   ESOPHAGOGASTRODUODENOSCOPY  2000   WNL per pt   ESOPHAGOGASTRODUODENOSCOPY  01/2020   gastric polyps, benign ([facility])   R hand surgery  1994   with tendon reconstruction   TONSILLECTOMY     WISDOM TOOTH EXTRACTION     x1   Allergies  Allergen Reactions   Elemental Sulfur Hives   Oxycodone Nausea Only   Tetanus Toxoids Other (See Comments)    Swollen, red arm         07/08/2023   12:22 PM 09/27/2021   10:04 AM 09/21/2020    3:45 PM  Depression screen PHQ 2/9  Decreased Interest 0 0 0  Down, Depressed, Hopeless 0 0 0  PHQ - 2 Score 0 0 0       08/28/2015    8:57 AM  GAD 7 : Generalized Anxiety Score  Nervous, Anxious, on Edge 3  Control/stop worrying 1  Worry too much - different things 2  Trouble relaxing 0  Restless 0  Easily annoyed or irritable 3  Afraid - awful might happen 1  Total GAD 7 Score 10  Anxiety Difficulty Somewhat difficult      Review of Systems  Constitutional:  Negative for chills and fever.  Respiratory:  Negative for shortness of breath.   Cardiovascular:  Negative for chest pain.  Gastrointestinal:  Negative for abdominal pain, constipation, diarrhea, heartburn, nausea and vomiting.  Genitourinary:  Negative for dysuria, frequency and urgency.  Skin:        Ingrown toe nail in right and left great toe.   Neurological:  Positive for dizziness. Negative for headaches.  Endo/Heme/Allergies:  Negative for polydipsia.  Psychiatric/Behavioral:  Negative for depression and suicidal ideas. The patient is not nervous/anxious.       Objective:     BP 116/70 (BP Location: Left Arm, Patient Position:  Sitting, Cuff Size: Large)   Pulse 62   Temp 97.9 F (36.6 C) (Oral)   Ht 5' 8.5 (1.74 m)   Wt 245 lb (111.1 kg)   SpO2 96%   BMI 36.71 kg/m  BP Readings from Last 3 Encounters:  07/08/23 116/70  09/30/22 136/82  09/27/21 138/70   Wt Readings from Last 3 Encounters:  07/08/23 245 lb (111.1 kg)  09/30/22 245 lb 6 oz (111.3 kg)  09/27/21 240 lb 6 oz (109 kg)      Physical Exam Vitals and nursing note reviewed.  Constitutional:      Appearance: Normal appearance.  Cardiovascular:     Rate and Rhythm: Normal rate and regular rhythm.     Pulses: Normal pulses.     Heart sounds: Normal heart sounds.  Pulmonary:     Effort: Pulmonary effort is normal.     Breath sounds: Normal breath sounds.  Neurological:     Mental Status: He is alert and oriented to person, place, and time.  Psychiatric:        Mood and Affect: Mood normal.        Behavior: Behavior normal.        Thought Content: Thought content normal.        Judgment: Judgment normal.      No results found for any visits on 07/08/23.  Last vitamin B12 and Folate Lab Results  Component Value Date   VITAMINB12 174 (L) 09/23/2022   FOLATE 17.8 09/20/2021      The 10-year ASCVD risk score (Arnett DK, et al., 2019) is: 3.6%    Assessment & Plan:  Vertigo Assessment & Plan: Recurrent.   Has been evaluated by ENT in the past.  Found PT effective.   Rx sent for Meclizine .  Discussed caution with caffeine and sodium intake. F/u with Dr. Crissie Dome in 1-2 weeks. Consider PT again.   Orders: -     Meclizine  HCl; Take 1 tablet (25 mg total) by mouth 3 (three) times daily as needed for dizziness.  Dispense: 30 tablet; Refill: 0  Dizziness Assessment & Plan: Suspect could be secondary to vertigo.  Will check cbc and bmp to rule out other causes.   Orders: -     Basic metabolic panel with GFR -     CBC  Ingrown nail of great toe Assessment & Plan: Bilateral.  Chronic.   Referral placed for  podiatry.  Orders: -     Ambulatory referral to Podiatry     Return for 1-2 weeks with Dr. Crissie Dome for follow up with vertigo.    Phillip Nation, NP

## 2023-07-08 NOTE — Patient Instructions (Signed)
 Stop by the lab prior to leaving today. I will notify you of your results once received.   You will either be contacted via phone regarding your referral to podiatrist, or you may receive a letter on your MyChart portal from our referral team with instructions for scheduling an appointment. Please let us  know if you have not been contacted by anyone within two weeks.  Schedule f/u with Dr. Crissie Dome in 1-2 weeks.   It was a pleasure meeting you!

## 2023-07-08 NOTE — Telephone Encounter (Signed)
 Appreciate [place] seeing pt today.

## 2023-07-09 ENCOUNTER — Ambulatory Visit: Payer: Self-pay | Admitting: General Practice

## 2023-07-09 LAB — BASIC METABOLIC PANEL WITH GFR
BUN: 17 mg/dL (ref 7–25)
CO2: 28 mmol/L (ref 20–32)
Calcium: 9.6 mg/dL (ref 8.6–10.3)
Chloride: 103 mmol/L (ref 98–110)
Creat: 1.05 mg/dL (ref 0.70–1.30)
Glucose, Bld: 97 mg/dL (ref 65–99)
Potassium: 4.2 mmol/L (ref 3.5–5.3)
Sodium: 141 mmol/L (ref 135–146)
eGFR: 86 mL/min/{1.73_m2} (ref >=60)

## 2023-07-09 LAB — CBC
HCT: 43.2 % (ref 38.5–50.0)
Hemoglobin: 14.4 g/dL (ref 13.2–17.1)
MCH: 28.2 pg (ref 27.0–33.0)
MCHC: 33.3 g/dL (ref 32.0–36.0)
MCV: 84.7 fL (ref 80.0–100.0)
MPV: 10.7 fL (ref 7.5–12.5)
Platelets: 251 10*3/uL (ref 140–400)
RBC: 5.1 10*6/uL (ref 4.20–5.80)
RDW: 13.2 % (ref 11.0–15.0)
WBC: 7.9 10*3/uL (ref 3.8–10.8)

## 2023-07-16 IMAGING — DX DG ELBOW COMPLETE 3+V*L*
4 series · 4 of 4 positions shown · non-contrast
Comparison: None.

CLINICAL DATA: Elbow pain

EXAM:
LEFT ELBOW - COMPLETE 3+ VIEW

[elbow ap]
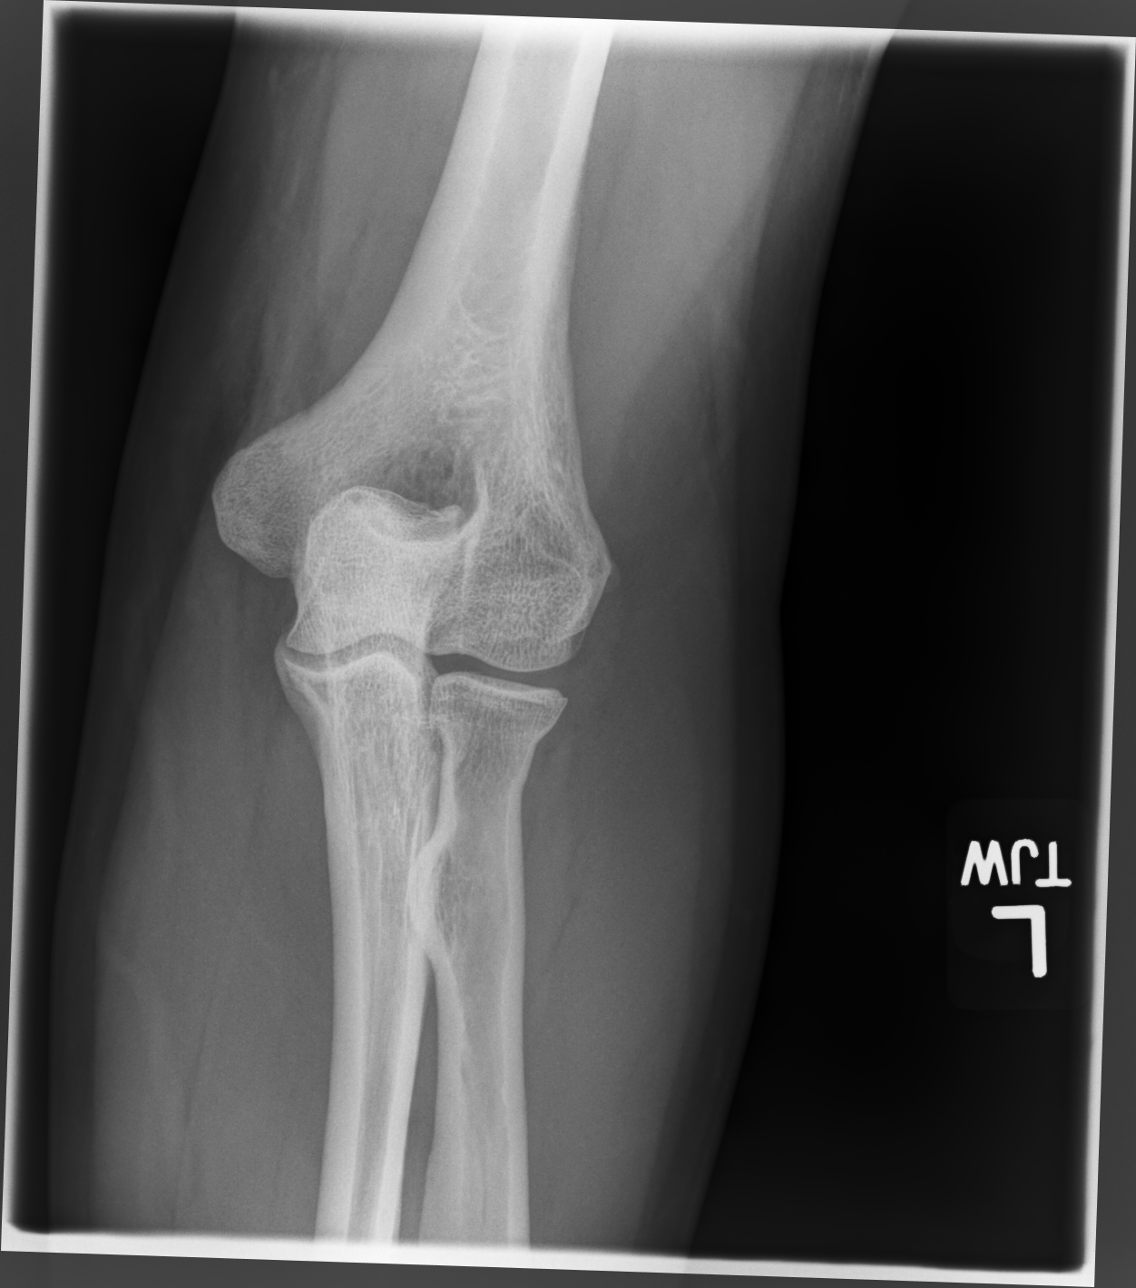

[elbow mlo]
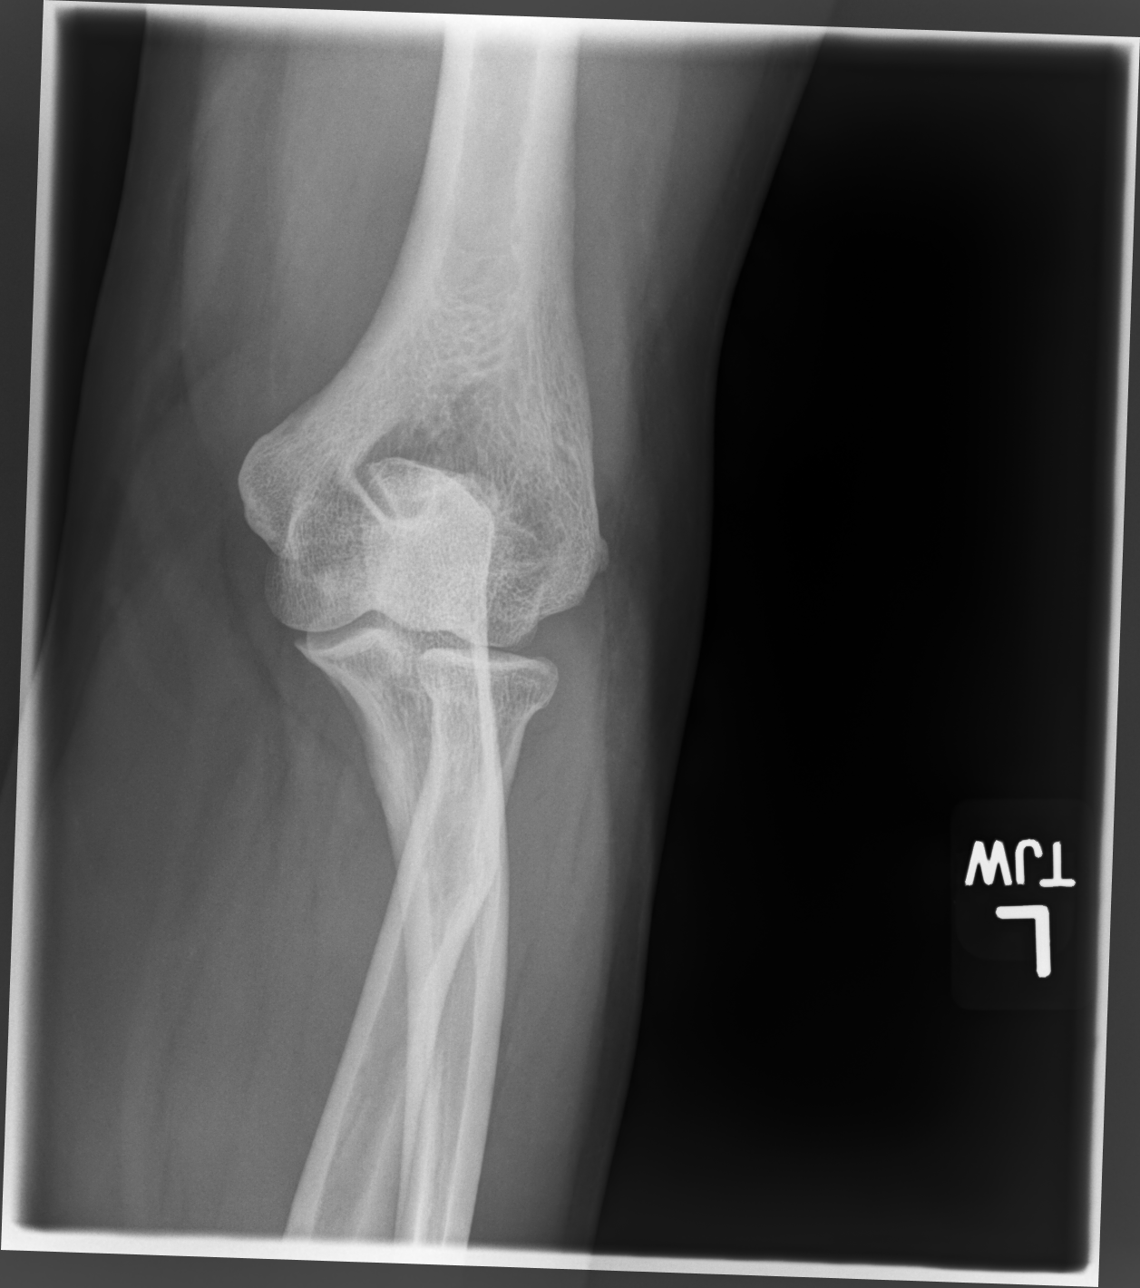

[elbow lmo]
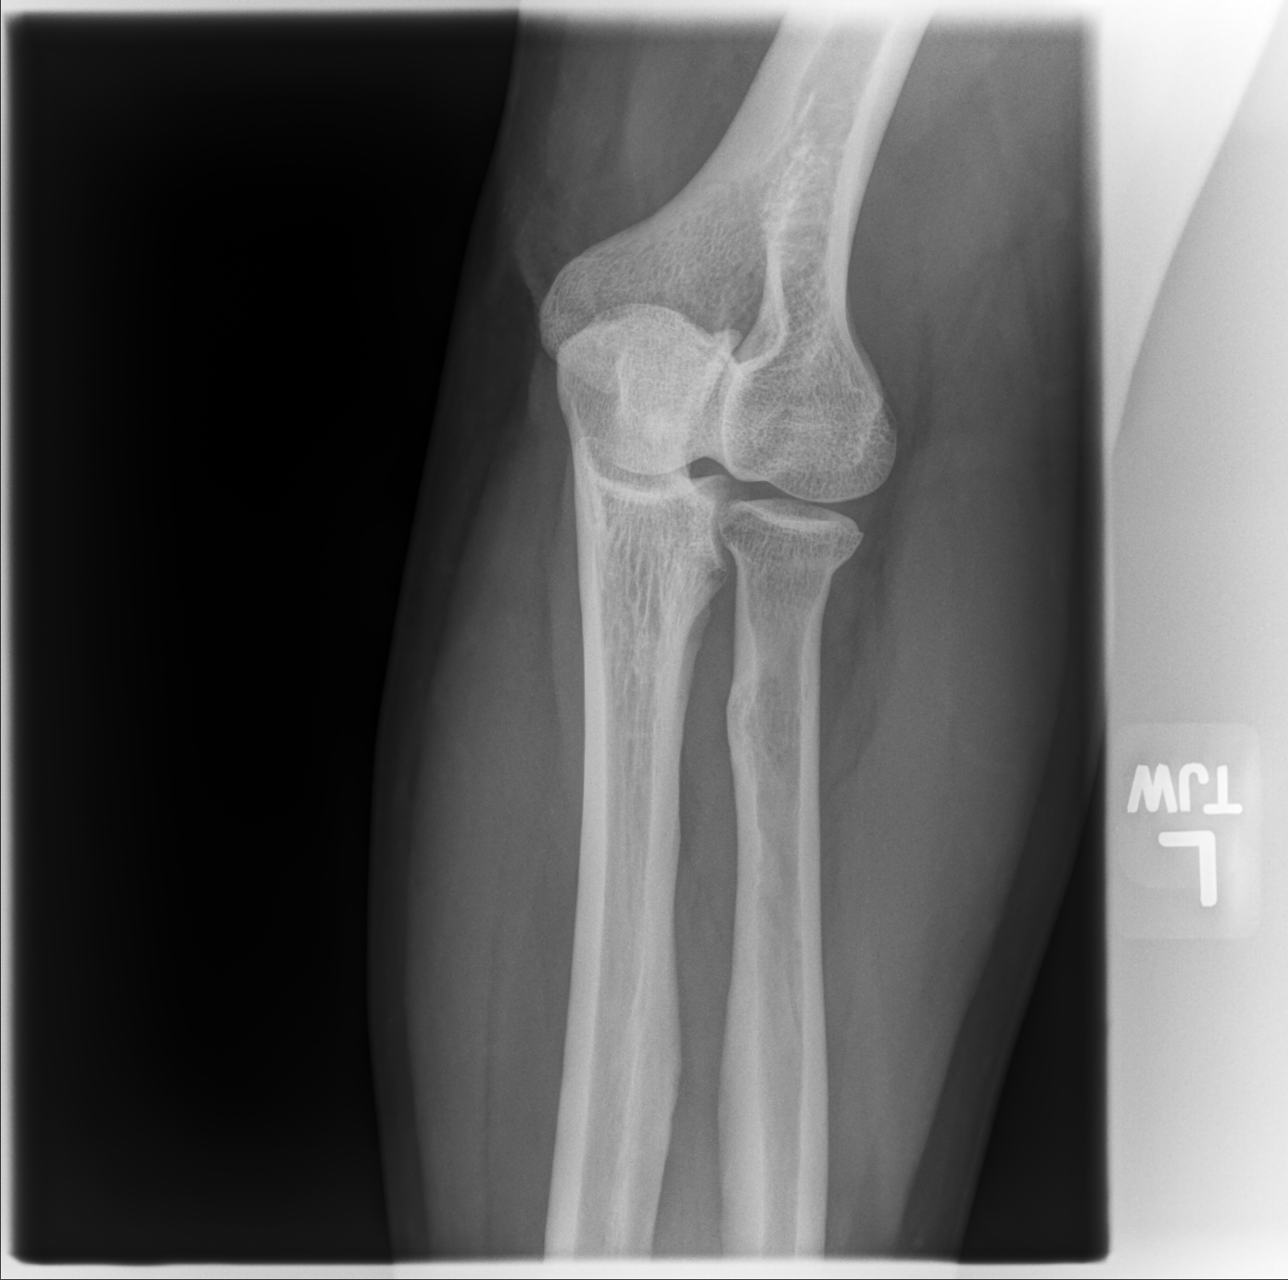

[elbow lat]
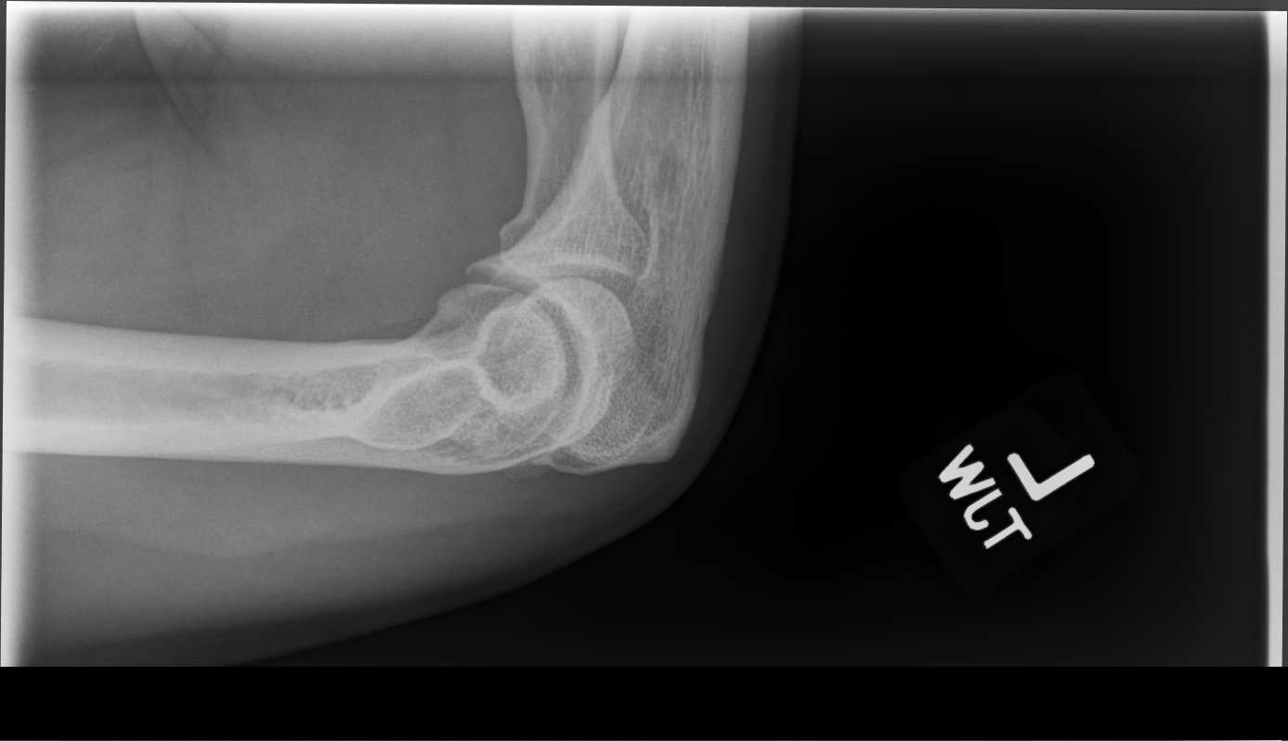

[4 of 4 positions shown; findings below may reference images not displayed]

FINDINGS: There is no evidence of fracture, dislocation, or joint effusion.
There is no evidence of arthropathy or other focal bone abnormality.
Soft tissues are unremarkable.
IMPRESSION: No acute osseous abnormality identified.

## 2023-07-20 ENCOUNTER — Ambulatory Visit: Payer: Self-pay | Admitting: Podiatry

## 2023-08-03 ENCOUNTER — Ambulatory Visit: Admitting: Family Medicine

## 2023-08-03 ENCOUNTER — Ambulatory Visit: Payer: Self-pay | Admitting: Podiatry

## 2023-11-17 ENCOUNTER — Ambulatory Visit: Admitting: Family Medicine

## 2023-11-17 ENCOUNTER — Encounter: Payer: Self-pay | Admitting: Family Medicine

## 2023-11-17 VITALS — BP 134/76 | HR 63 | Temp 98.5°F | Ht 68.5 in | Wt 236.5 lb

## 2023-11-17 DIAGNOSIS — S61419D Laceration without foreign body of unspecified hand, subsequent encounter: Secondary | ICD-10-CM | POA: Diagnosis not present

## 2023-11-17 MED ORDER — CEPHALEXIN 500 MG PO CAPS: 500.0000 mg | ORAL_CAPSULE | Freq: Four times a day (QID) | ORAL | 0 refills | Status: AC

## 2023-11-17 NOTE — Patient Instructions (Addendum)
 Keep the area clean and covered.   Exercise as tolerated.  Update us  as needed.  Finish keflex.   If redness or discharge then restart keflex and get rechecked.  Take care.  Glad to see you.

## 2023-11-17 NOTE — Progress Notes (Unsigned)
 UC f/u.  Bolt on a cart went into L hand.  Started on keflex, was given Tdap.  Wound cleaned at UC.  He was sore after the tetanus shot but didn't have lip or tongue swelling.  No fevers o/w.  He had pain using splint and had to take that off.  Covered in the meantime.    He is leaving town on 12/03/23 on vacation.    Meds, vitals, and allergies reviewed.   ROS: Per HPI unless specifically indicated in ROS section   Nad Ncat L hand with clean appearing 2 cm linear cut on the proximal L palm with distant NV function intact.  Doesn't appear infected.  Has granulation tissue locally.

## 2023-11-18 DIAGNOSIS — S61419A Laceration without foreign body of unspecified hand, initial encounter: Secondary | ICD-10-CM | POA: Insufficient documentation

## 2023-11-18 NOTE — Assessment & Plan Note (Signed)
 Without tendon deficit or sign of infection at this point.  Covered with nonstick bandage and then padded with 4 x 4.  I cut a hole out of the center of the 4 x 4's so that would go around the cut.  And I covered that with an Ace wrap.  Tolerated well.  He can still use a splint.  Discussed options.  Keep the area clean and covered.   Okay to exercise as tolerated.  Update us  as needed.  Finish keflex.   If redness or discharge then restart keflex and get rechecked.  He agrees with plan.
